# Patient Record
Sex: Male | Born: 1980
Health system: Southern US, Community
[De-identification: ages and names within clinical notes are randomized; demographics above are authoritative.]

## PROBLEM LIST (undated history)

## (undated) DIAGNOSIS — J45909 Unspecified asthma, uncomplicated: Secondary | ICD-10-CM

## (undated) DIAGNOSIS — R51 Headache: Secondary | ICD-10-CM

## (undated) DIAGNOSIS — R519 Headache, unspecified: Secondary | ICD-10-CM

## (undated) DIAGNOSIS — E119 Type 2 diabetes mellitus without complications: Secondary | ICD-10-CM

## (undated) HISTORY — DX: Unspecified asthma, uncomplicated: J45.909

## (undated) HISTORY — PX: OTHER SURGICAL HISTORY: SHX169

## (undated) HISTORY — DX: Type 2 diabetes mellitus without complications: E11.9

## (undated) HISTORY — PX: TONSILLECTOMY: SUR1361

## (undated) HISTORY — DX: Headache, unspecified: R51.9

## (undated) HISTORY — DX: Headache: R51

---

## 2001-08-08 ENCOUNTER — Encounter: Payer: Self-pay | Admitting: *Deleted

## 2001-08-08 ENCOUNTER — Emergency Department (HOSPITAL_COMMUNITY): Admission: EM | Admit: 2001-08-08 | Discharge: 2001-08-08 | Payer: Self-pay | Admitting: *Deleted

## 2002-03-12 ENCOUNTER — Encounter: Payer: Self-pay | Admitting: Emergency Medicine

## 2002-03-12 ENCOUNTER — Emergency Department (HOSPITAL_COMMUNITY): Admission: EM | Admit: 2002-03-12 | Discharge: 2002-03-12 | Payer: Self-pay | Admitting: Emergency Medicine

## 2006-08-04 ENCOUNTER — Emergency Department (HOSPITAL_COMMUNITY): Admission: EM | Admit: 2006-08-04 | Discharge: 2006-08-04 | Payer: Self-pay | Admitting: Emergency Medicine

## 2007-04-18 ENCOUNTER — Emergency Department (HOSPITAL_COMMUNITY): Admission: EM | Admit: 2007-04-18 | Discharge: 2007-04-18 | Payer: Self-pay | Admitting: Emergency Medicine
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
## 2009-04-04 IMAGING — CR DG FOREARM 2V*L*
2 series · 2 of 2 positions shown · non-contrast
Comparison: none

CLINICAL DATA: 26 year-old with left arm pain.
 LEFT FOREARM ? 2 VIEW:

[view not recorded (1 of 2)]
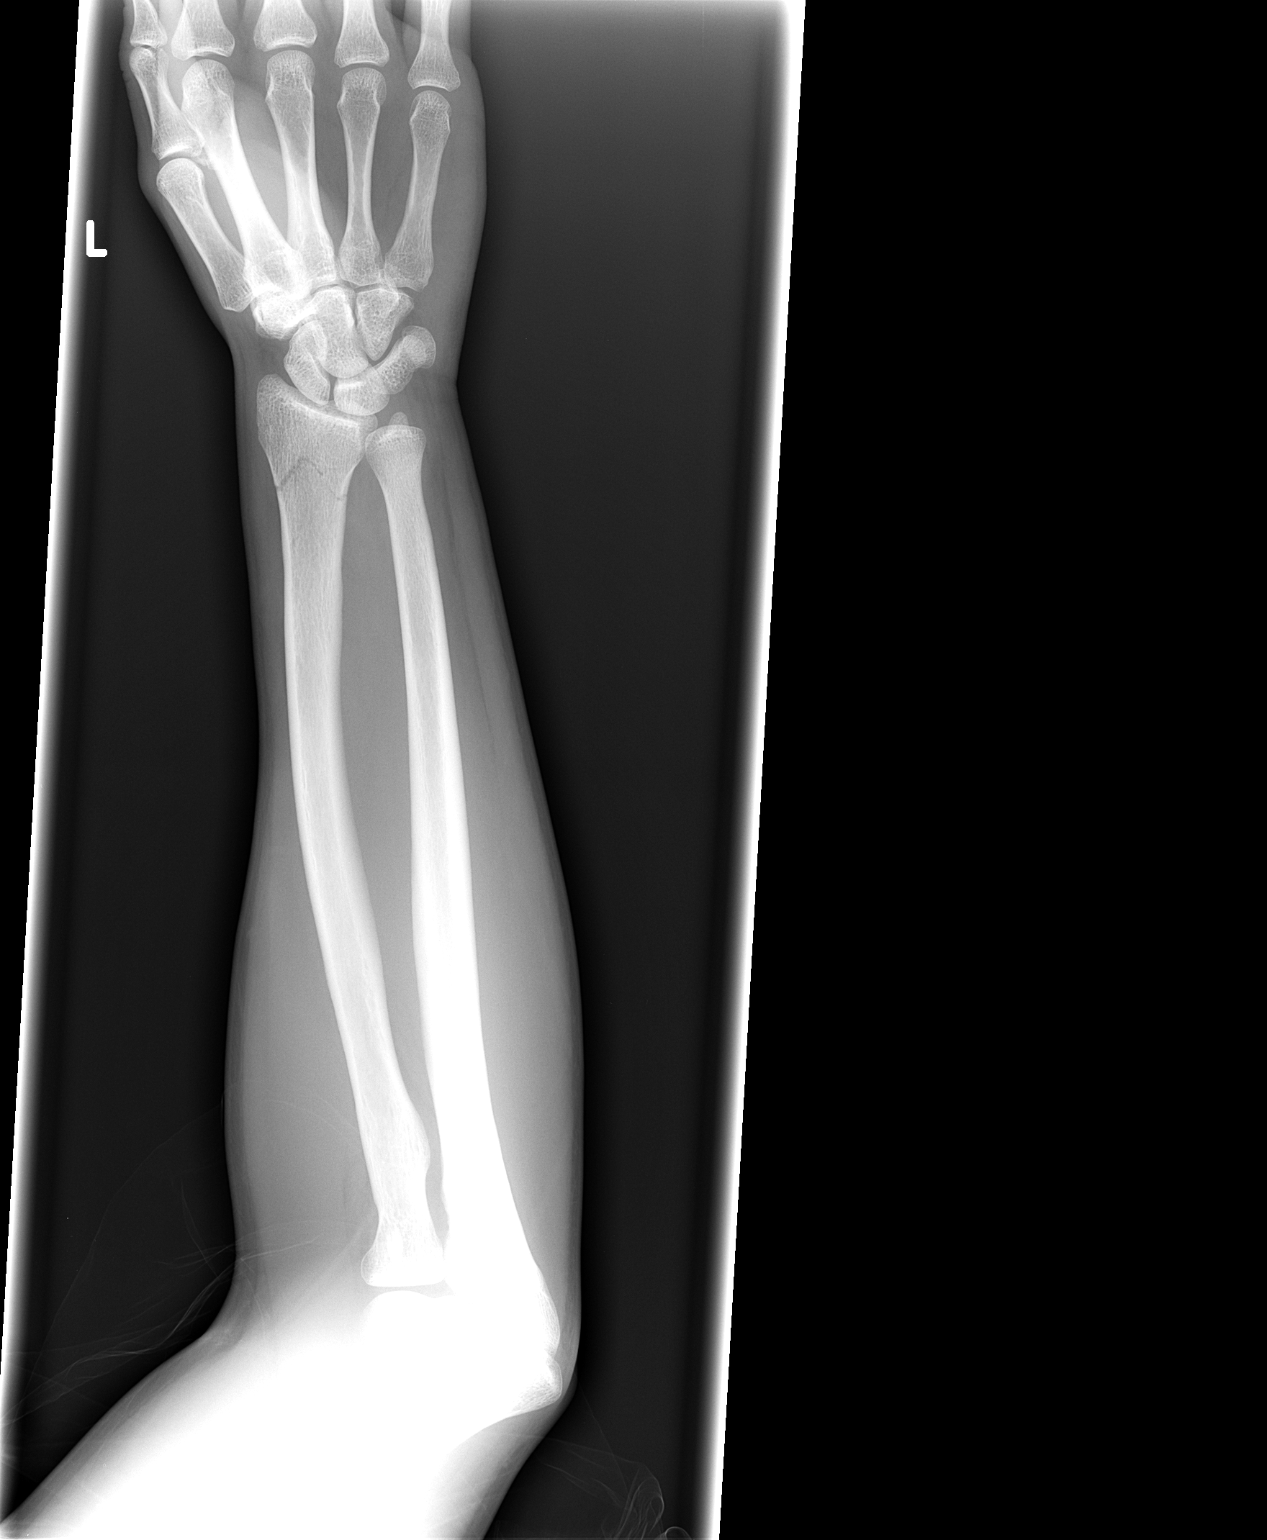

[view not recorded (2 of 2)]
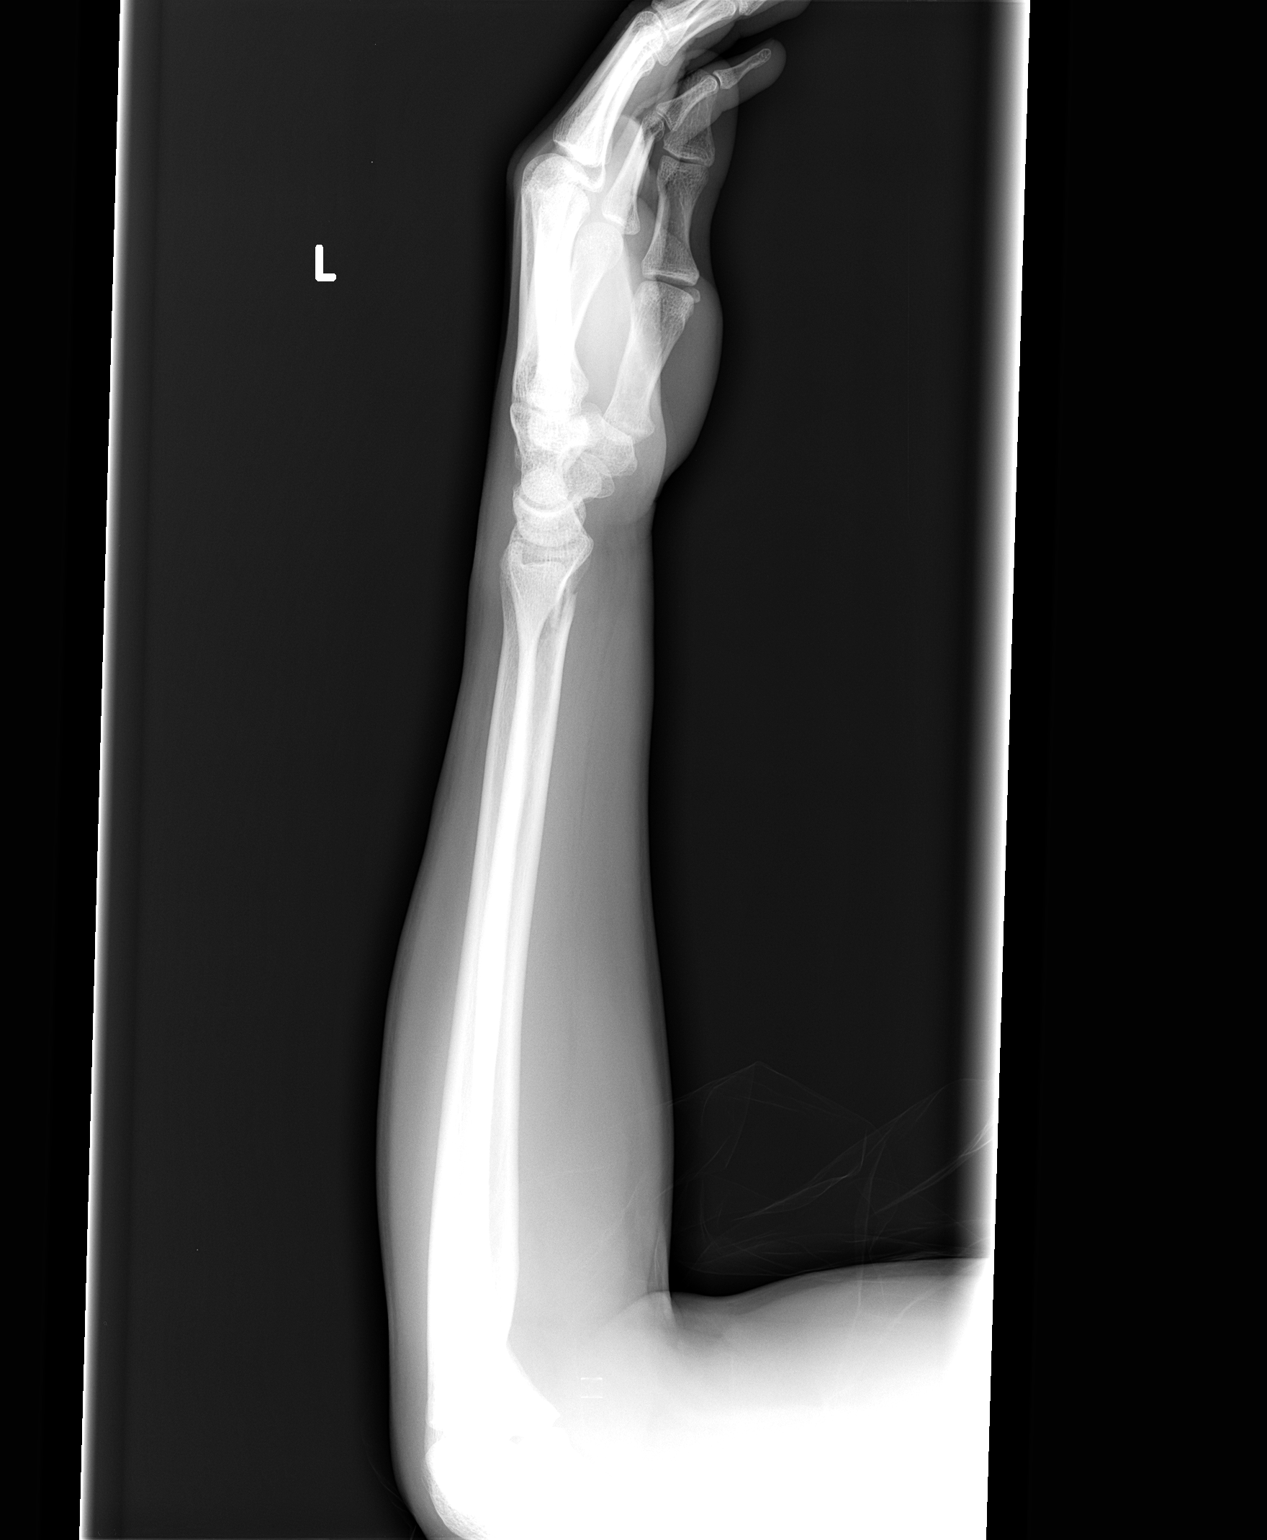

[2 of 2 positions shown; findings below may reference images not displayed]

FINDINGS: Two views of the left forearm demonstrate a minimally displaced fracture involving the distal left radius in the metaphyseal region. No other fractures are identified. The wrist is located.
IMPRESSION: Minimally displaced fracture of the distal left radius.

## 2009-05-16 ENCOUNTER — Ambulatory Visit: Payer: Self-pay | Admitting: Radiology

## 2009-05-16 ENCOUNTER — Emergency Department (HOSPITAL_BASED_OUTPATIENT_CLINIC_OR_DEPARTMENT_OTHER): Admission: EM | Admit: 2009-05-16 | Discharge: 2009-05-17 | Payer: Self-pay | Admitting: Emergency Medicine

## 2010-07-27 ENCOUNTER — Inpatient Hospital Stay (INDEPENDENT_AMBULATORY_CARE_PROVIDER_SITE_OTHER)
Admission: RE | Admit: 2010-07-27 | Discharge: 2010-07-27 | Disposition: A | Discharge status: Home / Self Care | Payer: Commercial Managed Care - PPO | Source: Ambulatory Visit | Attending: Family Medicine | Admitting: Family Medicine

## 2010-07-27 DIAGNOSIS — J069 Acute upper respiratory infection, unspecified: Secondary | ICD-10-CM

## 2014-05-14 ENCOUNTER — Encounter (HOSPITAL_COMMUNITY): Payer: Self-pay | Admitting: Emergency Medicine

## 2014-05-14 ENCOUNTER — Emergency Department (HOSPITAL_COMMUNITY)
Admission: EM | Admit: 2014-05-14 | Discharge: 2014-05-14 | Disposition: A | Discharge status: Home / Self Care | Payer: 59 | Source: Home / Self Care | Attending: Family Medicine | Admitting: Family Medicine

## 2014-05-14 DIAGNOSIS — S40012A Contusion of left shoulder, initial encounter: Secondary | ICD-10-CM

## 2014-05-14 DIAGNOSIS — S5002XA Contusion of left elbow, initial encounter: Secondary | ICD-10-CM

## 2014-05-14 MED ORDER — IBUPROFEN 800 MG PO TABS
800.0000 mg | ORAL_TABLET | Freq: Once | ORAL | Status: AC
  Administered 2014-05-14: 800 mg via ORAL

## 2014-05-14 MED ORDER — IBUPROFEN 800 MG PO TABS
ORAL_TABLET | ORAL | Status: AC
  Filled 2014-05-14: qty 1
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
## 2014-05-14 NOTE — Discharge Instructions (Signed)
Contusion A contusion is a deep bruise. Contusions are the result of an injury that caused bleeding under the skin. The contusion may turn blue, purple, or yellow. Minor injuries will give you a painless contusion, but more severe contusions may stay painful and swollen for a few weeks.  CAUSES  A contusion is usually caused by a blow, trauma, or direct force to an area of the body. SYMPTOMS   Swelling and redness of the injured area.  Bruising of the injured area.  Tenderness and soreness of the injured area.  Pain. DIAGNOSIS  The diagnosis can be made by taking a history and physical exam. An X-ray, CT scan, or MRI may be needed to determine if there were any associated injuries, such as fractures. TREATMENT  Specific treatment will depend on what area of the body was injured. In general, the best treatment for a contusion is resting, icing, elevating, and applying cold compresses to the injured area. Over-the-counter medicines may also be recommended for pain control. Ask your caregiver what the best treatment is for your contusion. HOME CARE INSTRUCTIONS   Put ice on the injured area.  Put ice in a plastic bag.  Place a towel between your skin and the bag.  Leave the ice on for 15-20 minutes, 3-4 times a day, or as directed by your health care provider.  Only take over-the-counter or prescription medicines for pain, discomfort, or fever as directed by your caregiver. Your caregiver may recommend avoiding anti-inflammatory medicines (aspirin, ibuprofen, and naproxen) for 48 hours because these medicines may increase bruising.  Rest the injured area.  If possible, elevate the injured area to reduce swelling. SEEK IMMEDIATE MEDICAL CARE IF:   You have increased bruising or swelling.  You have pain that is getting worse.  Your swelling or pain is not relieved with medicines. MAKE SURE YOU:   Understand these instructions.  Will watch your condition.  Will get help right  away if you are not doing well or get worse. Document Released: 01/11/2005 Document Revised: 04/08/2013 Document Reviewed: 02/06/2011 [facility] Patient Information 2015 [place], [place]. This information is not intended to replace advice given to you by your health care provider. Make sure you discuss any questions you have with your health care provider.  Elbow Contusion An elbow contusion is a deep bruise of the elbow. Contusions are the result of an injury that caused bleeding under the skin. The contusion may turn blue, purple, or yellow. Minor injuries will give you a painless contusion, but more severe contusions may stay painful and swollen for a few weeks.  CAUSES  An elbow contusion comes from a direct force to that area, such as falling on the elbow. SYMPTOMS   Swelling and redness of the elbow.  Bruising of the elbow area.  Tenderness or soreness of the elbow. DIAGNOSIS  You will have a physical exam and will be asked about your history. You may need an X-ray of your elbow to look for a broken bone (fracture).  TREATMENT  A sling or splint may be needed to support your injury. Resting, elevating, and applying cold compresses to the elbow area are often the best treatments for an elbow contusion. Over-the-counter medicines may also be recommended for pain control. HOME CARE INSTRUCTIONS   Put ice on the injured area.  Put ice in a plastic bag.  Place a towel between your skin and the bag.  Leave the ice on for 15-20 minutes, 03-04 times a day.  Only take  over-the-counter or prescription medicines for pain, discomfort, or fever as directed by your caregiver.  Rest your injured elbow until the pain and swelling are better.  Elevate your elbow to reduce swelling.  Apply a compression wrap as directed by your caregiver. This can help reduce swelling and motion. You may remove the wrap for sleeping, showers, and baths. If your fingers become numb, cold, or blue, take the wrap  off and reapply it more loosely.  Use your elbow only as directed by your caregiver. You may be asked to do range of motion exercises. Do them as directed.  See your caregiver as directed. It is very important to keep all follow-up appointments in order to avoid any long-term problems with your elbow, including chronic pain or inability to move your elbow normally. SEEK IMMEDIATE MEDICAL CARE IF:   You have increased redness, swelling, or pain in your elbow.  Your swelling or pain is not relieved with medicines.  You have swelling of the hand and fingers.  You are unable to move your fingers or wrist.  You begin to lose feeling in your hand or fingers.  Your fingers or hand become cold or blue. MAKE SURE YOU:   Understand these instructions.  Will watch your condition.  Will get help right away if you are not doing well or get worse. Document Released: 03/12/2006 Document Revised: 06/26/2011 Document Reviewed: 02/17/2011 [facility] Patient Information 2015 [place], [place]. This information is not intended to replace advice given to you by your health care provider. Make sure you discuss any questions you have with your health care provider.

## 2014-05-14 NOTE — ED Notes (Signed)
Reports falling this am, left elbow, left shoulder, landing on concrete.
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
## 2014-05-14 NOTE — ED Notes (Signed)
Provided ice pack

## 2015-05-06 DIAGNOSIS — Z131 Encounter for screening for diabetes mellitus: Secondary | ICD-10-CM | POA: Diagnosis not present

## 2015-05-06 DIAGNOSIS — R51 Headache: Secondary | ICD-10-CM | POA: Diagnosis not present

## 2015-05-06 DIAGNOSIS — Z0000 Encounter for general adult medical examination without abnormal findings: Secondary | ICD-10-CM | POA: Diagnosis not present

## 2015-05-06 DIAGNOSIS — Z13220 Encounter for screening for lipoid disorders: Secondary | ICD-10-CM | POA: Diagnosis not present

## 2015-06-12 DIAGNOSIS — J0100 Acute maxillary sinusitis, unspecified: Secondary | ICD-10-CM | POA: Diagnosis not present

## 2016-07-06 ENCOUNTER — Telehealth: Payer: 59 | Admitting: Family

## 2016-07-06 DIAGNOSIS — J019 Acute sinusitis, unspecified: Secondary | ICD-10-CM

## 2016-07-06 MED ORDER — FLUTICASONE PROPIONATE 50 MCG/ACT NA SUSP: 2.0000 | Freq: Every day | NASAL | 6 refills | Status: DC

## 2016-07-06 NOTE — Progress Notes (Signed)
We are sorry that you are not feeling well.  Here is how we plan to help!  Based on what you have shared with me it looks like you have sinusitis.  Sinusitis is inflammation and infection in the sinus cavities of the head.  Based on your presentation I believe you most likely have Acute Viral Sinusitis.This is an infection most likely caused by a virus. There is not specific treatment for viral sinusitis other than to help you with the symptoms until the infection runs its course.  You may use an oral decongestant such as Mucinex D or if you have glaucoma or high blood pressure use plain Mucinex. Saline nasal spray help and can safely be used as often as needed for congestion, I have prescribed: Fluticasone nasal spray two sprays in each nostril daily.  Some authorities believe that zinc sprays or the use of Echinacea may shorten the course of your symptoms.  Sinus infections are not as easily transmitted as other respiratory infection, however we still recommend that you avoid close contact with loved ones, especially the very young and elderly.  Remember to wash your hands thoroughly throughout the day as this is the number one way to prevent the spread of infection!  Home Care:  Only take medications as instructed by your medical team.  Complete the entire course of an antibiotic.  Do not take these medications with alcohol.  A steam or ultrasonic humidifier can help congestion.  You can place a towel over your head and breathe in the steam from hot water coming from a faucet.  Avoid close contacts especially the very young and the elderly.  Cover your mouth when you cough or sneeze.  Always remember to wash your hands.  Get Help Right Away If:  You develop worsening fever or sinus pain.  You develop a severe head ache or visual changes.  Your symptoms persist after you have completed your treatment plan.  Make sure you  Understand these instructions.  Will watch your  condition.  Will get help right away if you are not doing well or get worse.  Your e-visit answers were reviewed by a board certified advanced clinical practitioner to complete your personal care plan.  Depending on the condition, your plan could have included both over the counter or prescription medications.  If there is a problem please reply  once you have received a response from your provider.  Your safety is important to us.  If you have drug allergies check your prescription carefully.    You can use MyChart to ask questions about today's visit, request a non-urgent call back, or ask for a work or school excuse for 24 hours related to this e-Visit. If it has been greater than 24 hours you will need to follow up with your provider, or enter a new e-Visit to address those concerns.  You will get an e-mail in the next two days asking about your experience.  I hope that your e-visit has been valuable and will speed your recovery. Thank you for using e-visits.

## 2016-08-10 DIAGNOSIS — J018 Other acute sinusitis: Secondary | ICD-10-CM | POA: Diagnosis not present

## 2016-10-24 DIAGNOSIS — R51 Headache: Secondary | ICD-10-CM | POA: Diagnosis not present

## 2016-10-24 DIAGNOSIS — R739 Hyperglycemia, unspecified: Secondary | ICD-10-CM | POA: Diagnosis not present

## 2016-10-24 DIAGNOSIS — R079 Chest pain, unspecified: Secondary | ICD-10-CM | POA: Diagnosis not present

## 2016-11-08 DIAGNOSIS — E119 Type 2 diabetes mellitus without complications: Secondary | ICD-10-CM | POA: Diagnosis not present

## 2017-02-08 DIAGNOSIS — E119 Type 2 diabetes mellitus without complications: Secondary | ICD-10-CM | POA: Diagnosis not present

## 2017-08-13 DIAGNOSIS — Z23 Encounter for immunization: Secondary | ICD-10-CM | POA: Diagnosis not present

## 2017-08-13 DIAGNOSIS — Z13220 Encounter for screening for lipoid disorders: Secondary | ICD-10-CM | POA: Diagnosis not present

## 2017-08-13 DIAGNOSIS — E119 Type 2 diabetes mellitus without complications: Secondary | ICD-10-CM | POA: Diagnosis not present

## 2017-08-13 DIAGNOSIS — J019 Acute sinusitis, unspecified: Secondary | ICD-10-CM | POA: Diagnosis not present

## 2017-08-13 DIAGNOSIS — Z0000 Encounter for general adult medical examination without abnormal findings: Secondary | ICD-10-CM | POA: Diagnosis not present

## 2017-08-13 MED FILL — AMOX-CLAV 875-125 MG TABLET: 875-125 | 10 days supply | Qty: 20 | Fill #0

## 2017-08-13 MED FILL — FLUTICASONE PROP 50 MCG SPR: 50 | 30 days supply | Qty: 16 | Fill #0

## 2017-09-16 ENCOUNTER — Other Ambulatory Visit: Payer: Self-pay

## 2017-09-16 ENCOUNTER — Encounter (HOSPITAL_COMMUNITY): Payer: Self-pay | Admitting: Emergency Medicine

## 2017-09-16 ENCOUNTER — Ambulatory Visit (HOSPITAL_COMMUNITY)
Admission: EM | Admit: 2017-09-16 | Discharge: 2017-09-16 | Disposition: A | Discharge status: Home / Self Care | Payer: 59 | Attending: Emergency Medicine | Admitting: Emergency Medicine

## 2017-09-16 DIAGNOSIS — Z885 Allergy status to narcotic agent status: Secondary | ICD-10-CM | POA: Diagnosis not present

## 2017-09-16 DIAGNOSIS — H9202 Otalgia, left ear: Secondary | ICD-10-CM | POA: Diagnosis not present

## 2017-09-16 DIAGNOSIS — Z79899 Other long term (current) drug therapy: Secondary | ICD-10-CM | POA: Insufficient documentation

## 2017-09-16 DIAGNOSIS — J029 Acute pharyngitis, unspecified: Secondary | ICD-10-CM | POA: Insufficient documentation

## 2017-09-16 LAB — POCT RAPID STREP A: Streptococcus, Group A Screen (Direct): NEGATIVE

## 2017-09-16 MED ORDER — IBUPROFEN 800 MG PO TABS: 800.0000 mg | ORAL_TABLET | Freq: Three times a day (TID) | ORAL | 0 refills | Status: DC

## 2017-09-16 NOTE — ED Triage Notes (Addendum)
Sore throat and left ear soreness for 2 days. Denies fever.  Throat tenderness is on left side, not right side of neck.

## 2017-09-16 NOTE — Discharge Instructions (Signed)

## 2017-09-17 NOTE — ED Provider Notes (Signed)
[facility]    CSN: [id number] Arrival date & time: 09/16/17  1925     History   Chief Complaint Chief Complaint  Patient presents with  . Otalgia    HPI Dennis Simon is a 37 y.o. male presenting today for evaluation of a sore throat and left ear pain.  Patient states that symptoms began Friday, approximately 3 days ago.  He states that he is felt swelling in his left neck that he believes has been worsening.  Having difficulties sleeping.  Has progressed to left ear pain.  Denies any other URI symptoms of congestion or coughing.  Denies fevers.  Denies nausea or vomiting.  He is taking an allergy pill daily for 3 to 4 weeks, as well as more recently ibuprofen.  HPI  History reviewed. No pertinent past medical history.  There are no active problems to display for this patient.   History reviewed. No pertinent surgical history.     Home Medications    Prior to Admission medications   Medication Sig Start Date End Date Taking? Authorizing Provider  loratadine (CLARITIN) 10 MG tablet Take 10 mg by mouth daily.   Yes [provider]  loratadine-pseudoephedrine (PX ALLERGY RELIEF D, LORATID,) 5-120 MG tablet Take 1 tablet by mouth 2 (two) times daily.   Yes [provider]  fluticasone (FLONASE) 50 MCG/ACT nasal spray Place 2 sprays into both nostrils daily. 07/06/16   Withrow, Everardo AllJohn C, FNP  ibuprofen (ADVIL,MOTRIN) 800 MG tablet Take 1 tablet (800 mg total) by mouth 3 (three) times daily. 09/16/17   Wieters, Junius CreamerHallie C, PA-C    Family History History reviewed. No pertinent family history.  Social History Social History   Tobacco Use  . Smoking status: Never Smoker  Substance Use Topics  . Alcohol use: No  . Drug use: No     Allergies   Hydrocodone   Review of Systems Review of Systems  Constitutional: Negative for activity change, appetite change, fatigue and fever.  HENT: Positive for ear pain and sore throat. Negative for  congestion, postnasal drip, rhinorrhea and sinus pressure.   Eyes: Negative for pain and itching.  Respiratory: Negative for cough and shortness of breath.   Cardiovascular: Negative for chest pain.  Gastrointestinal: Negative for abdominal pain, diarrhea, nausea and vomiting.  Musculoskeletal: Negative for myalgias.  Skin: Negative for rash.  Neurological: Negative for dizziness, light-headedness and headaches.     Physical Exam Triage Vital Signs ED Triage Vitals  Enc Vitals Group     BP 09/16/17 2014 139/89     Pulse Rate 09/16/17 2014 71     Resp 09/16/17 2014 18     Temp 09/16/17 2014 98.4 F (36.9 C)     Temp Source 09/16/17 2014 Oral     SpO2 09/16/17 2014 100 %     Weight --      Height --      Head Circumference --      Peak Flow --      Pain Score 09/16/17 2012 7     Pain Loc --      Pain Edu? --      Excl. in GC? --    No data found.  Updated Vital Signs BP 139/89 (BP Location: Left Arm)   Pulse 71   Temp 98.4 F (36.9 C) (Oral)   Resp 18   SpO2 100%   Visual Acuity Right Eye Distance:   Left Eye Distance:   Bilateral Distance:  Right Eye Near:   Left Eye Near:    Bilateral Near:     Physical Exam  Constitutional: He appears well-developed and well-nourished. No distress.  HENT:  Head: Normocephalic and atraumatic.  Bilateral ears without tenderness to palpation of external auricle, tragus and mastoid, EAC's without erythema or swelling, TM's with good bony landmarks and cone of light. Non erythematous.  Oral mucosa pink and moist, no tonsillar enlargement or exudate. Posterior pharynx patent and erythematous-drainage present in posterior pharynx, no uvula deviation or swelling. Normal phonation.   Eyes: Conjunctivae are normal.  Neck: Neck supple.  No cervical lymph adenopathy, patient did have mild tenderness to palpation of left tonsillar area  Cardiovascular: Normal rate and regular rhythm.  No murmur heard. Pulmonary/Chest: Effort  normal and breath sounds normal. No respiratory distress.  Breathing comfortably at rest, CTABL, no wheezing, rales or other adventitious sounds auscultated  Abdominal: Soft. There is no tenderness.  Musculoskeletal: He exhibits no edema.  Neurological: He is alert.  Skin: Skin is warm and dry.  Psychiatric: He has a normal mood and affect.  Nursing note and vitals reviewed.    UC Treatments / Results  Labs (all labs ordered are listed, but only abnormal results are displayed) Labs Reviewed  CULTURE, GROUP A STREP [facility])  POCT RAPID STREP A    EKG None  Radiology No results found.  Procedures Procedures (including critical care time)  Medications Ordered in UC Medications - No data to display  Initial Impression / Assessment and Plan / UC Course  I have reviewed the triage vital signs and the nursing notes.  Pertinent labs & imaging results that were available during my care of the patient were reviewed by me and considered in my medical decision making (see chart for details).     Strep test negative, likely viral etiology versus related to worsening drainage.  Discussed OTC measures below for further management.  Expect self resolution.Discussed strict return precautions. Patient verbalized understanding and is agreeable with plan.  Final Clinical Impressions(s) / UC Diagnoses   Final diagnoses:  Sore throat     Discharge Instructions     Sore Throat  Your rapid strep tested Negative today. We will send for a culture and call in about 2 days if results are positive. For now we will treat your sore throat as a virus with symptom management.   Please continue Tylenol or Ibuprofen for fever and pain. May try salt water gargles, cepacol lozenges, throat spray, or OTC cold relief medicine for throat discomfort. If you also have congestion take a daily anti-histamine like Zyrtec, Claritin, and a oral decongestant to help with post nasal drip that may be irritating your  throat.   Stay hydrated and drink plenty of fluids to keep your throat coated relieve irritation.    ED Prescriptions    Medication Sig Dispense Auth. Provider   ibuprofen (ADVIL,MOTRIN) 800 MG tablet Take 1 tablet (800 mg total) by mouth 3 (three) times daily. 21 tablet Wieters, [place] C, PA-C     Controlled Substance Prescriptions [place] Controlled Substance Registry consulted? Not Applicable   Lew Dawes, [place] 09/17/17 1610

## 2017-09-19 LAB — CULTURE, GROUP A STREP (THRC)

## 2017-12-12 DIAGNOSIS — M25562 Pain in left knee: Secondary | ICD-10-CM | POA: Diagnosis not present

## 2017-12-12 MED FILL — MELOXICAM 15 MG TABLET: 15 | 30 days supply | Qty: 30 | Fill #0

## 2018-02-13 DIAGNOSIS — R42 Dizziness and giddiness: Secondary | ICD-10-CM | POA: Diagnosis not present

## 2018-02-13 DIAGNOSIS — R51 Headache: Secondary | ICD-10-CM | POA: Diagnosis not present

## 2018-02-13 DIAGNOSIS — E119 Type 2 diabetes mellitus without complications: Secondary | ICD-10-CM | POA: Diagnosis not present

## 2018-04-19 DIAGNOSIS — H903 Sensorineural hearing loss, bilateral: Secondary | ICD-10-CM | POA: Diagnosis not present

## 2018-04-19 DIAGNOSIS — R42 Dizziness and giddiness: Secondary | ICD-10-CM | POA: Diagnosis not present

## 2018-04-19 DIAGNOSIS — H8111 Benign paroxysmal vertigo, right ear: Secondary | ICD-10-CM | POA: Diagnosis not present

## 2018-04-23 DIAGNOSIS — E119 Type 2 diabetes mellitus without complications: Secondary | ICD-10-CM | POA: Diagnosis not present

## 2018-05-14 ENCOUNTER — Encounter: Payer: Self-pay | Admitting: *Deleted

## 2018-05-15 ENCOUNTER — Ambulatory Visit: Payer: 59 | Admitting: Neurology

## 2018-05-15 ENCOUNTER — Encounter: Payer: Self-pay | Admitting: Neurology

## 2018-05-15 VITALS — BP 136/84 | HR 56 | Ht 67.5 in | Wt 191.0 lb

## 2018-05-15 DIAGNOSIS — A881 Epidemic vertigo: Secondary | ICD-10-CM

## 2018-05-15 DIAGNOSIS — R42 Dizziness and giddiness: Secondary | ICD-10-CM | POA: Diagnosis not present

## 2018-05-15 DIAGNOSIS — G43909 Migraine, unspecified, not intractable, without status migrainosus: Secondary | ICD-10-CM | POA: Insufficient documentation

## 2018-05-15 DIAGNOSIS — G43709 Chronic migraine without aura, not intractable, without status migrainosus: Secondary | ICD-10-CM

## 2018-05-15 DIAGNOSIS — IMO0002 Reserved for concepts with insufficient information to code with codable children: Secondary | ICD-10-CM

## 2018-05-15 MED ORDER — SUMATRIPTAN SUCCINATE 100 MG PO TABS: 100.0000 mg | ORAL_TABLET | Freq: Once | ORAL | 6 refills | Status: DC | PRN

## 2018-05-15 MED ORDER — TOPIRAMATE 100 MG PO TABS: 100.0000 mg | ORAL_TABLET | Freq: Two times a day (BID) | ORAL | 11 refills | Status: DC

## 2018-05-15 MED FILL — SUMAtriptan SUCCINATE 100 M: 100 | 30 days supply | Qty: 9 | Fill #0

## 2018-05-15 MED FILL — TOPIRAMATE 100 MG TABLET: 100 | 30 days supply | Qty: 60 | Fill #0

## 2018-05-15 NOTE — Progress Notes (Signed)
PATIENT: Dennis DouglasBradford D Cashen DOB: 1980-10-15  Chief Complaint  Patient presents with  . Headache    He estimates having 15 headache days each month, over the last few months.  He does not get relief with OTC NSAIDS.  He sometimes has dizziness, nausea and vomiting.  He has seen by his eye doctor with no concerns.  He has seen ENT, who did the Epley Maneuever in the office, which helped his dizziness some.  He has another follow up with ENT next week.  Marland Kitchen. PCP    Farris HasMorrow, Aaron, MD     HISTORICAL  Dennis Simon is a 38 year old male, seen in request by his primary care physician Dr. Farris HasMorrow, Aaron, for evaluation of headaches.  Initial evaluation was on May 15, 2018.  I have reviewed and summarized the referring note from the referring physician.  He began to develop headaches since 2016, gradually getting worse, for a while he was having daily headaches, has been taking ibuprofen 16 tablets or more on a daily basis, his typical headache are retro- orbital area severe pounding headaches with associated light, noise sensitivity, nauseous, one side over worse than the other side, lasting for hours or longer,  He was never treated with triptan or preventive medications in the past, he was told by his primary care physician to stop daily ibuprofen use to avoid rebound headache since 2019, he has mild improvement, now he has 2-3 major headaches each week, only partially relieved by ibuprofen,  His mother died of brain aneurysm at age 432,  Since 2018, he also had recurrent episode of sudden onset vertigo, can lasting hours to couple days, often associated with headaches, was seen by ENT, patient described nystagmus that was triggered by repositioning maneuver, he also complains of mild hearing loss, does working at noisy environment at [employer] [place]  REVIEW OF SYSTEMS: Full 14 system review of systems performed and notable only for as above All other review of systems  were negative.  ALLERGIES: Allergies  Allergen Reactions  . Hydrocodone Anaphylaxis    HOME MEDICATIONS: Current Outpatient Medications  Medication Sig Dispense Refill  . ibuprofen (ADVIL,MOTRIN) 200 MG tablet Take 200-400 mg by mouth as needed.     No current facility-administered medications for this visit.     PAST MEDICAL HISTORY: Past Medical History:  Diagnosis Date  . Diabetes (HCC)   . Headache     PAST SURGICAL HISTORY: Past Surgical History:  Procedure Laterality Date  . TONSILLECTOMY    . tubes in ears      FAMILY HISTORY: Family History  Problem Relation Age of Onset  . Diabetes Mother   . Aneurysm Mother   . Other Father        died in house fire  . Cancer Maternal Grandmother        unsure of type    SOCIAL HISTORY: Social History   Socioeconomic History  . Marital status: Legally Separated    Spouse name: Not on file  . Number of children: 1  . Years of education: 3012  . Highest education level: High school graduate  Occupational History  . Occupation: facility maintenance tech  Social Needs  . Financial resource strain: Not on file  . Food insecurity:    Worry: Not on file    Inability: Not on file  . Transportation needs:    Medical: Not on file    Non-medical: Not on file  Tobacco Use  . Smoking  status: Never Smoker  . Smokeless tobacco: Never Used  Substance and Sexual Activity  . Alcohol use: No  . Drug use: No  . Sexual activity: Not on file  Lifestyle  . Physical activity:    Days per week: Not on file    Minutes per session: Not on file  . Stress: Not on file  Relationships  . Social connections:    Talks on phone: Not on file    Gets together: Not on file    Attends religious service: Not on file    Active member of club or organization: Not on file    Attends meetings of clubs or organizations: Not on file    Relationship status: Not on file  . Intimate partner violence:    Fear of current or ex partner: Not on  file    Emotionally abused: Not on file    Physically abused: Not on file    Forced sexual activity: Not on file  Other Topics Concern  . Not on file  Social History Narrative   Lives at home alone.   Right-handed.   3-4 cups caffeine per day.     PHYSICAL EXAM   Vitals:   05/15/18 0711  BP: 136/84  Pulse: (!) 56  Weight: 191 lb (86.6 kg)  Height: 5' 7.5" (1.715 m)    Not recorded      Body mass index is 29.47 kg/m.  PHYSICAL EXAMNIATION:  Gen: NAD, conversant, well nourised, obese, well groomed                     Cardiovascular: Regular rate rhythm, no peripheral edema, warm, nontender. Eyes: Conjunctivae clear without exudates or hemorrhage Neck: Supple, no carotid bruits. Pulmonary: Clear to auscultation bilaterally   NEUROLOGICAL EXAM:  MENTAL STATUS: Speech:    Speech is normal; fluent and spontaneous with normal comprehension.  Cognition:     Orientation to time, place and person     Normal recent and remote memory     Normal Attention span and concentration     Normal Language, naming, repeating,spontaneous speech     Fund of knowledge   CRANIAL NERVES: CN II: Visual fields are full to confrontation. Fundoscopic exam is normal with sharp discs and no vascular changes. Pupils are round equal and briskly reactive to light. CN III, IV, VI: extraocular movement are normal. No ptosis. CN V: Facial sensation is intact to pinprick in all 3 divisions bilaterally. Corneal responses are intact.  CN VII: Face is symmetric with normal eye closure and smile. CN VIII: Hearing is normal to rubbing fingers CN IX, X: Palate elevates symmetrically. Phonation is normal. CN XI: Head turning and shoulder shrug are intact CN XII: Tongue is midline with normal movements and no atrophy.  MOTOR: There is no pronator drift of out-stretched arms. Muscle bulk and tone are normal. Muscle strength is normal.  REFLEXES: Reflexes are 2+ and symmetric at the biceps, triceps,  knees, and ankles. Plantar responses are flexor.  SENSORY: Intact to light touch, pinprick, positional sensation and vibratory sensation are intact in fingers and toes.  COORDINATION: Rapid alternating movements and fine finger movements are intact. There is no dysmetria on finger-to-nose and heel-knee-shin.    GAIT/STANCE: Posture is normal. Gait is steady with normal steps, base, arm swing, and turning. Heel and toe walking are normal. Tandem gait is normal.  Romberg is absent.   DIAGNOSTIC DATA (LABS, IMAGING, TESTING) - I reviewed patient records, labs, notes, testing  and imaging myself where available.   ASSESSMENT AND PLAN  Dennis DouglasBradford D Anthes is a 38 y.o. male   Chronic migraine headaches Episodic vertigo  MRI of the brain with without contrast to rule out inner ear pathology  Could be in the spectrum of his migraine headaches  Also has a component of medicine rebound headache  I have advised him to stop frequent ibuprofen use  Start Topamax 100 mg twice a day as preventive medication  Imitrex 100 mg as needed    Levert FeinsteinYijun Ahlijah Raia, M.D. Ph.D.  [facility] [address], [address] [place], [place] [address] Ph: [phone] Fax: [phone]  CC:Farris HasMorrow, Aaron, MD

## 2018-05-16 ENCOUNTER — Telehealth: Payer: Self-pay | Admitting: Neurology

## 2018-05-16 DIAGNOSIS — H81399 Other peripheral vertigo, unspecified ear: Secondary | ICD-10-CM

## 2018-05-16 NOTE — Addendum Note (Signed)
Addended by: Levert Feinstein on: 05/16/2018 10:24 AM   Modules accepted: Orders

## 2018-05-16 NOTE — Telephone Encounter (Signed)
MR Brain w/wo contrast Dr. [facility] Auth: NPR Ref # [id number]. Patient is scheduled at [facility] for 05/21/18

## 2018-05-16 NOTE — Telephone Encounter (Signed)
When you get a chance can you put a new MRI order in because for some reason the mobile unit will not let me schedule it when the IAC is attached to it.Dennis Simon

## 2018-05-16 NOTE — Telephone Encounter (Signed)
Noted, thank you

## 2018-05-21 ENCOUNTER — Other Ambulatory Visit: Payer: 59

## 2018-08-29 ENCOUNTER — Ambulatory Visit: Payer: 59 | Admitting: Neurology

## 2018-09-16 DIAGNOSIS — Z0000 Encounter for general adult medical examination without abnormal findings: Secondary | ICD-10-CM | POA: Diagnosis not present

## 2018-09-16 DIAGNOSIS — E119 Type 2 diabetes mellitus without complications: Secondary | ICD-10-CM | POA: Diagnosis not present

## 2018-09-16 DIAGNOSIS — R51 Headache: Secondary | ICD-10-CM | POA: Diagnosis not present

## 2019-01-22 DIAGNOSIS — R030 Elevated blood-pressure reading, without diagnosis of hypertension: Secondary | ICD-10-CM | POA: Diagnosis not present

## 2019-01-22 DIAGNOSIS — M62838 Other muscle spasm: Secondary | ICD-10-CM | POA: Diagnosis not present

## 2019-01-22 DIAGNOSIS — H8113 Benign paroxysmal vertigo, bilateral: Secondary | ICD-10-CM | POA: Diagnosis not present

## 2019-01-22 MED FILL — CYCLOBENZAPRINE 10 MG TAB: 10 | 30 days supply | Qty: 30 | Fill #0

## 2019-01-22 MED FILL — MELOXICAM 15 MG TABLET: 15 | 30 days supply | Qty: 30 | Fill #0

## 2019-05-14 DIAGNOSIS — E119 Type 2 diabetes mellitus without complications: Secondary | ICD-10-CM | POA: Diagnosis not present

## 2019-05-14 DIAGNOSIS — R030 Elevated blood-pressure reading, without diagnosis of hypertension: Secondary | ICD-10-CM | POA: Diagnosis not present

## 2019-06-20 DIAGNOSIS — E119 Type 2 diabetes mellitus without complications: Secondary | ICD-10-CM | POA: Diagnosis not present

## 2019-06-20 DIAGNOSIS — R030 Elevated blood-pressure reading, without diagnosis of hypertension: Secondary | ICD-10-CM | POA: Diagnosis not present

## 2019-06-25 MED FILL — metFORMIN HCL 500 MG TABS: 500 | 90 days supply | Qty: 180 | Fill #0

## 2019-09-25 DIAGNOSIS — E119 Type 2 diabetes mellitus without complications: Secondary | ICD-10-CM | POA: Diagnosis not present

## 2019-09-25 DIAGNOSIS — Z13220 Encounter for screening for lipoid disorders: Secondary | ICD-10-CM | POA: Diagnosis not present

## 2019-09-25 DIAGNOSIS — Z0000 Encounter for general adult medical examination without abnormal findings: Secondary | ICD-10-CM | POA: Diagnosis not present

## 2019-09-25 DIAGNOSIS — R350 Frequency of micturition: Secondary | ICD-10-CM | POA: Diagnosis not present

## 2020-10-28 DIAGNOSIS — Z125 Encounter for screening for malignant neoplasm of prostate: Secondary | ICD-10-CM | POA: Diagnosis not present

## 2020-10-28 DIAGNOSIS — Z0000 Encounter for general adult medical examination without abnormal findings: Secondary | ICD-10-CM | POA: Diagnosis not present

## 2020-10-28 DIAGNOSIS — E119 Type 2 diabetes mellitus without complications: Secondary | ICD-10-CM | POA: Diagnosis not present

## 2020-10-28 DIAGNOSIS — Z13220 Encounter for screening for lipoid disorders: Secondary | ICD-10-CM | POA: Diagnosis not present

## 2020-11-12 ENCOUNTER — Other Ambulatory Visit (HOSPITAL_COMMUNITY): Payer: Self-pay

## 2020-11-12 MED ORDER — PRAVASTATIN SODIUM 20 MG PO TABS
20.0000 mg | ORAL_TABLET | Freq: Every day | ORAL | 3 refills | Status: DC
  Filled 2020-11-12: qty 90, 90d supply, fill #0

## 2020-11-22 ENCOUNTER — Other Ambulatory Visit (HOSPITAL_COMMUNITY): Payer: Self-pay

## 2021-01-17 DIAGNOSIS — E119 Type 2 diabetes mellitus without complications: Secondary | ICD-10-CM | POA: Diagnosis not present

## 2021-03-04 DIAGNOSIS — J019 Acute sinusitis, unspecified: Secondary | ICD-10-CM | POA: Diagnosis not present

## 2021-07-01 DIAGNOSIS — M545 Low back pain, unspecified: Secondary | ICD-10-CM | POA: Diagnosis not present

## 2021-07-01 DIAGNOSIS — E119 Type 2 diabetes mellitus without complications: Secondary | ICD-10-CM | POA: Diagnosis not present

## 2021-07-05 ENCOUNTER — Other Ambulatory Visit (HOSPITAL_COMMUNITY): Payer: Self-pay

## 2021-08-03 DIAGNOSIS — J302 Other seasonal allergic rhinitis: Secondary | ICD-10-CM | POA: Diagnosis not present

## 2021-08-03 DIAGNOSIS — J0100 Acute maxillary sinusitis, unspecified: Secondary | ICD-10-CM | POA: Diagnosis not present

## 2021-09-08 ENCOUNTER — Ambulatory Visit: Payer: 59 | Admitting: Neurology

## 2021-09-08 ENCOUNTER — Encounter: Payer: Self-pay | Admitting: Neurology

## 2021-09-08 VITALS — BP 142/93 | HR 61 | Ht 67.0 in | Wt 174.0 lb

## 2021-09-08 DIAGNOSIS — G43709 Chronic migraine without aura, not intractable, without status migrainosus: Secondary | ICD-10-CM

## 2021-09-08 DIAGNOSIS — H9190 Unspecified hearing loss, unspecified ear: Secondary | ICD-10-CM | POA: Insufficient documentation

## 2021-09-08 MED ORDER — PROPRANOLOL HCL ER 80 MG PO CP24: 80.0000 mg | ORAL_CAPSULE | Freq: Every day | ORAL | 11 refills | Status: DC

## 2021-09-08 MED ORDER — RIZATRIPTAN BENZOATE 10 MG PO TBDP: 10.0000 mg | ORAL_TABLET | ORAL | 6 refills | Status: DC | PRN

## 2021-09-08 NOTE — Progress Notes (Addendum)
Chief Complaint  Patient presents with   New Patient (Initial Visit)    Room 13 NX Dennis Simon 04/2018/Paper [name] MD [place] at [place] [id number] follow up/ Reports 2-3 headaches a week, not currently taking medication for it       ASSESSMENT AND PLAN  Dennis Simon is a 41 y.o. male   Chronic migraine headaches Slow onset bilateral hearing loss, increased tinnitus,  Mother died of brain aneurysm at age 12 He is worriedabout the possibility of brain structure abnormality, proceed with MRI of the brain with without contrast through internal acoustic canal Inderal LA 80 mg as preventive medication Maxalt 10 mg as needed Return to clinic With nurse practitioner in 6 months    DIAGNOSTIC DATA (LABS, IMAGING, TESTING) - I reviewed patient records, labs, notes, testing and imaging myself where available.  From Dennis Simon in March 2023, normal CMP creatinine of 1, with exception of elevated glucose 133, A1c 6.6, LDL 119,  MEDICAL HISTORY:  Dennis Simon is a 41 year old male, seen in request by his primary care doctor Dennis Simon, for evaluation of chronic migraine headaches,  I reviewed and summarized the referring note. PMHX Pre-DM  I saw him in 2020 for chronic migraine headaches, which started in his late 11s, holoacranial pounding headache with light noise sensitivity, nauseous, lasting hours of days  His mother died of brain aneurysm at age 56  He was put on Topamax 100 mg twice a day as preventive medications, his headache did improve, less severe, he tried Imitrex, which works well for his headache, but after Imitrex, he often felt skin on fire  After he ran out of the prescription around 20, he relied on his over-the-counter ibuprofen as needed, reported worsening headaches since October 2022, he can have up to 15 headaches daily a month, variable degree, most severe headaches, even 5 tablets of ibuprofen would not touch it, severe pounding  headache with light noise sensitivity, nauseous, occasionally headache is preceded by decreased peripheral visual field.  Treatment for his migraine light, weather changes, heavily seasoned food,  He also complains of intermittent bilateral tinnitus, gradual onset bilateral hearing loss over the past few years  PHYSICAL EXAM:   Vitals:   09/08/21 0745  BP: (!) 142/93  Pulse: 61  Weight: 174 lb (78.9 kg)  Height: 5\' 7"  (1.702 m)     Body mass index is 27.25 kg/m.  PHYSICAL EXAMNIATION:  Gen: NAD, conversant, well nourised, well groomed                     Cardiovascular: Regular rate rhythm, no peripheral edema, warm, nontender. Eyes: Conjunctivae clear without exudates or hemorrhage Neck: Supple, no carotid bruits. Pulmonary: Clear to auscultation bilaterally   NEUROLOGICAL EXAM:  MENTAL STATUS: Speech/cognition: Awake, alert, oriented to history taking and casual conversation CRANIAL NERVES: CN II: Visual fields are full to confrontation. Pupils are round equal and briskly reactive to light. CN III, IV, VI: extraocular movement are normal. No ptosis. CN V: Facial sensation is intact to light touch CN VII: Face is symmetric with normal eye closure  CN VIII: Mild decreased hearing on casual conversation, air conduction more than pulmonary conduction, Weber's test were midline CN IX, X: Phonation is normal. CN XI: Head turning and shoulder shrug are intact  MOTOR: There is no pronator drift of out-stretched arms. Muscle bulk and tone are normal. Muscle strength is normal.  REFLEXES: Reflexes are 2+ and symmetric at the biceps, triceps, knees, and  ankles. Plantar responses are flexor.  SENSORY: Intact to light touch, pinprick and vibratory sensation are intact in fingers and toes.  COORDINATION: There is no trunk or limb dysmetria noted.  GAIT/STANCE: Posture is normal. Gait is steady with normal steps, base, arm swing, and turning. Heel and toe walking are normal.  Tandem gait is normal.  Romberg is absent.  REVIEW OF SYSTEMS:  Full 14 system review of systems performed and notable only for as above All other review of systems were negative.   ALLERGIES: Allergies  Allergen Reactions   Hydrocodone Anaphylaxis    HOME MEDICATIONS: Current Outpatient Medications  Medication Sig Dispense Refill   ibuprofen (ADVIL,MOTRIN) 200 MG tablet Take 200-400 mg by mouth as needed.     No current facility-administered medications for this visit.    PAST MEDICAL HISTORY: Past Medical History:  Diagnosis Date   Diabetes ([place])    Headache     PAST SURGICAL HISTORY: Past Surgical History:  Procedure Laterality Date   TONSILLECTOMY     tubes in ears      FAMILY HISTORY: Family History  Problem Relation Age of Onset   Diabetes Mother    Aneurysm Mother    Other Father        died in house fire   Cancer Maternal Grandmother        unsure of type    SOCIAL HISTORY: Social History   Socioeconomic History   Marital status: Legally Separated    Spouse name: Not on file   Number of children: 1   Years of education: 12   Highest education level: High school graduate  Occupational History   Occupation: facility maintenance tech  Tobacco Use   Smoking status: Never   Smokeless tobacco: Never  Substance and Sexual Activity   Alcohol use: No   Drug use: No   Sexual activity: Not on file  Other Topics Concern   Not on file  Social History Narrative   Lives at home alone.   Right-handed.   3-4 cups caffeine per day.   Social Determinants of Health   Financial Resource Strain: Not on file  Food Insecurity: Not on file  Transportation Needs: Not on file  Physical Activity: Not on file  Stress: Not on file  Social Connections: Not on file  Intimate Partner Violence: Not on file      Dennis Simon, M.D. Ph.D.  [facility] [address] [place], [place] [address] Ph: [phone] Fax:  [phone]  CC:  Dennis Pepper, MD [place],  [place] [address]  Dennis Pepper, MD  -----------------------

## 2021-09-15 ENCOUNTER — Telehealth: Payer: Self-pay | Admitting: Neurology

## 2021-09-15 NOTE — Telephone Encounter (Signed)
[facility] Auth: NPR case [id number] sent to GI

## 2021-09-26 ENCOUNTER — Encounter: Payer: Self-pay | Admitting: Neurology

## 2021-09-28 DIAGNOSIS — K649 Unspecified hemorrhoids: Secondary | ICD-10-CM | POA: Diagnosis not present

## 2021-09-29 ENCOUNTER — Other Ambulatory Visit: Payer: 59

## 2021-11-02 DIAGNOSIS — E1169 Type 2 diabetes mellitus with other specified complication: Secondary | ICD-10-CM | POA: Diagnosis not present

## 2021-11-02 DIAGNOSIS — Z0000 Encounter for general adult medical examination without abnormal findings: Secondary | ICD-10-CM | POA: Diagnosis not present

## 2021-11-02 DIAGNOSIS — Z125 Encounter for screening for malignant neoplasm of prostate: Secondary | ICD-10-CM | POA: Diagnosis not present

## 2021-11-02 DIAGNOSIS — R413 Other amnesia: Secondary | ICD-10-CM | POA: Diagnosis not present

## 2021-11-02 DIAGNOSIS — Z13220 Encounter for screening for lipoid disorders: Secondary | ICD-10-CM | POA: Diagnosis not present

## 2021-11-02 DIAGNOSIS — R5383 Other fatigue: Secondary | ICD-10-CM | POA: Diagnosis not present

## 2021-12-05 DIAGNOSIS — E291 Testicular hypofunction: Secondary | ICD-10-CM | POA: Diagnosis not present

## 2021-12-05 DIAGNOSIS — R945 Abnormal results of liver function studies: Secondary | ICD-10-CM | POA: Diagnosis not present

## 2021-12-26 DIAGNOSIS — E291 Testicular hypofunction: Secondary | ICD-10-CM | POA: Diagnosis not present

## 2022-03-08 ENCOUNTER — Encounter: Payer: Self-pay | Admitting: Neurology

## 2022-03-13 ENCOUNTER — Ambulatory Visit (INDEPENDENT_AMBULATORY_CARE_PROVIDER_SITE_OTHER): Payer: 59 | Admitting: Neurology

## 2022-03-13 ENCOUNTER — Ambulatory Visit: Payer: Self-pay | Admitting: Neurology

## 2022-03-13 DIAGNOSIS — G43709 Chronic migraine without aura, not intractable, without status migrainosus: Secondary | ICD-10-CM

## 2022-03-15 ENCOUNTER — Other Ambulatory Visit (HOSPITAL_COMMUNITY): Payer: Self-pay

## 2022-03-15 MED ORDER — PROPRANOLOL HCL ER 80 MG PO CP24
80.0000 mg | ORAL_CAPSULE | Freq: Every day | ORAL | 11 refills | Status: DC
  Filled 2022-03-15: qty 30, 30d supply, fill #0

## 2022-03-16 NOTE — Progress Notes (Signed)
cancelled

## 2022-03-29 ENCOUNTER — Ambulatory Visit: Payer: 59 | Admitting: Neurology

## 2022-03-29 ENCOUNTER — Other Ambulatory Visit (HOSPITAL_COMMUNITY): Payer: Self-pay

## 2022-03-29 ENCOUNTER — Encounter: Payer: Self-pay | Admitting: Neurology

## 2022-03-29 VITALS — BP 125/78 | HR 70 | Ht 66.0 in | Wt 180.0 lb

## 2022-03-29 DIAGNOSIS — G43709 Chronic migraine without aura, not intractable, without status migrainosus: Secondary | ICD-10-CM | POA: Diagnosis not present

## 2022-03-29 DIAGNOSIS — H9190 Unspecified hearing loss, unspecified ear: Secondary | ICD-10-CM | POA: Diagnosis not present

## 2022-03-29 MED ORDER — ALPRAZOLAM 1 MG PO TABS
ORAL_TABLET | ORAL | 0 refills | Status: DC
  Filled 2022-03-29 – 2022-04-14 (×2): qty 3, 30d supply, fill #0

## 2022-03-29 MED ORDER — PROPRANOLOL HCL ER 80 MG PO CP24
80.0000 mg | ORAL_CAPSULE | Freq: Every day | ORAL | 3 refills | Status: DC
  Filled 2022-03-29 – 2022-04-14 (×3): qty 90, 90d supply, fill #0
  Filled 2022-07-16: qty 90, 90d supply, fill #1

## 2022-03-29 NOTE — Progress Notes (Signed)
Chief Complaint  Patient presents with   Follow-up    Rm 13, alone States propranolol is working well, reports less headaches       ASSESSMENT AND PLAN  Dennis Simon is a 41 y.o. male   Chronic migraine headaches Slow onset bilateral hearing loss, increased tinnitus,  Mother died of brain aneurysm at age 77 He is worriedabout the possibility of brain structure abnormality, proceed with MRI of the brain with without contrast through internal acoustic canal order was submitted to [facility], Xanax as needed Inderal LA 80 mg as preventive medication works well for him Maxalt 10 mg as needed helps his headache    Will inform him MRI report, DIAGNOSTIC DATA (LABS, IMAGING, TESTING) - I reviewed patient records, labs, notes, testing and imaging myself where available.  From [place] in March 2023, normal CMP creatinine of 1, with exception of elevated glucose 133, A1c 6.6, LDL 119,  MEDICAL HISTORY:  Dennis Simon is a 41 year old male, seen in request by his primary care doctor Farris Has, for evaluation of chronic migraine headaches,  I reviewed and summarized the referring note. PMHX Pre-DM  I saw him in 2020 for chronic migraine headaches, which started in his late 18s, holoacranial pounding headache with light noise sensitivity, nauseous, lasting hours of days  His mother died of brain aneurysm at age 19  He was put on Topamax 100 mg twice a day as preventive medications, his headache did improve, less severe, he tried Imitrex, which works well for his headache, but after Imitrex, he often felt skin on fire  After he ran out of the prescription around 20, he relied on his over-the-counter ibuprofen as needed, reported worsening headaches since October 2022, he can have up to 15 headaches daily a month, variable degree, most severe headaches, even 5 tablets of ibuprofen would not touch it, severe pounding headache with light noise sensitivity, nauseous, occasionally  headache is preceded by decreased peripheral visual field.  Treatment for his migraine light, weather changes, heavily seasoned food,  He also complains of intermittent bilateral tinnitus, gradual onset bilateral hearing loss over the past few years  UPDATE Mar 29 2022: He had 1 major migraine headache a month ago after eating some soups, he contributed to salt load, no hip pain or attention to his food, also started inderal since November 2023, it helped his headache, tried Maxalt few times, works well  PHYSICAL EXAM:   Vitals:   03/29/22 1537  BP: 125/78  Pulse: 70  Weight: 180 lb (81.6 kg)  Height: 5\' 6"  (1.676 m)     Body mass index is 29.05 kg/m.  PHYSICAL EXAMNIATION:  Gen: NAD, conversant, well nourised, well groomed                     Cardiovascular: Regular rate rhythm, no peripheral edema, warm, nontender. Eyes: Conjunctivae clear without exudates or hemorrhage Neck: Supple, no carotid bruits. Pulmonary: Clear to auscultation bilaterally   NEUROLOGICAL EXAM:  MENTAL STATUS: Speech/cognition: Awake, alert, oriented to history taking and casual conversation CRANIAL NERVES: CN II: Visual fields are full to confrontation. Pupils are round equal and briskly reactive to light. CN III, IV, VI: extraocular movement are normal. No ptosis. CN V: Facial sensation is intact to light touch CN VII: Face is symmetric with normal eye closure  CN VIII:  , air conduction more than pulmonary conduction, Weber's test were midline CN IX, X: Phonation is normal. CN XI: Head turning and  shoulder shrug are intact  MOTOR: There is no pronator drift of out-stretched arms. Muscle bulk and tone are normal. Muscle strength is normal.  REFLEXES: Reflexes are 2+ and symmetric at the biceps, triceps, knees, and ankles. Plantar responses are flexor.  SENSORY: Intact to light touch, pinprick and vibratory sensation are intact in fingers and toes.  COORDINATION: There is no trunk or  limb dysmetria noted.  GAIT/STANCE: Posture is normal. Gait is steady with normal steps  Romberg is absent.  REVIEW OF SYSTEMS:  Full 14 system review of systems performed and notable only for as above All other review of systems were negative.   ALLERGIES: Allergies  Allergen Reactions   Hydrocodone Anaphylaxis    HOME MEDICATIONS: Current Outpatient Medications  Medication Sig Dispense Refill   propranolol ER (INDERAL LA) 80 MG 24 hr capsule Take 1 capsule (80 mg total) by mouth daily. 30 capsule 11   rizatriptan (MAXALT-MLT) 10 MG disintegrating tablet Take 1 tablet (10 mg total) by mouth as needed. May repeat in 2 hours if needed 10 tablet 6   No current facility-administered medications for this visit.    PAST MEDICAL HISTORY: Past Medical History:  Diagnosis Date   Diabetes (HCC)    Headache     PAST SURGICAL HISTORY: Past Surgical History:  Procedure Laterality Date   TONSILLECTOMY     tubes in ears      FAMILY HISTORY: Family History  Problem Relation Age of Onset   Diabetes Mother    Aneurysm Mother    Other Father        died in house fire   Cancer Maternal Grandmother        unsure of type    SOCIAL HISTORY: Social History   Socioeconomic History   Marital status: Legally Separated    Spouse name: Not on file   Number of children: 1   Years of education: 12   Highest education level: High school graduate  Occupational History   Occupation: facility maintenance tech  Tobacco Use   Smoking status: Never   Smokeless tobacco: Never  Substance and Sexual Activity   Alcohol use: No   Drug use: No   Sexual activity: Not on file  Other Topics Concern   Not on file  Social History Narrative   Lives at home alone.   Right-handed.   3-4 cups caffeine per day.   Social Determinants of Health   Financial Resource Strain: Not on file  Food Insecurity: Not on file  Transportation Needs: Not on file  Physical Activity: Not on file  Stress:  Not on file  Social Connections: Not on file  Intimate Partner Violence: Not on file      Levert Feinstein, M.D. Ph.D.  [facility] [address], [address] [place], [place] [address] Ph: [phone] Fax: [phone]  CC:  Farris Has, MD [address] [place],  [place] [address]  Farris Has, MD  -----------------------

## 2022-03-30 ENCOUNTER — Other Ambulatory Visit (HOSPITAL_COMMUNITY): Payer: Self-pay

## 2022-04-03 ENCOUNTER — Telehealth: Payer: Self-pay | Admitting: Neurology

## 2022-04-03 NOTE — Telephone Encounter (Signed)
[facility] NPR sent to [facility] [phone]

## 2022-04-11 ENCOUNTER — Other Ambulatory Visit (HOSPITAL_COMMUNITY): Payer: Self-pay

## 2022-04-14 ENCOUNTER — Other Ambulatory Visit: Payer: Self-pay

## 2022-04-14 ENCOUNTER — Other Ambulatory Visit (HOSPITAL_COMMUNITY): Payer: Self-pay

## 2022-04-17 ENCOUNTER — Other Ambulatory Visit (HOSPITAL_COMMUNITY): Payer: Self-pay

## 2022-07-17 ENCOUNTER — Other Ambulatory Visit: Payer: Self-pay

## 2022-07-27 ENCOUNTER — Other Ambulatory Visit (HOSPITAL_COMMUNITY): Payer: Self-pay

## 2022-07-27 MED ORDER — AZITHROMYCIN 250 MG PO TABS
ORAL_TABLET | ORAL | 0 refills | Status: DC
  Filled 2022-07-27: qty 6, 5d supply, fill #0

## 2022-08-22 ENCOUNTER — Other Ambulatory Visit (HOSPITAL_COMMUNITY): Payer: Self-pay

## 2022-08-22 DIAGNOSIS — R001 Bradycardia, unspecified: Secondary | ICD-10-CM | POA: Diagnosis not present

## 2022-08-22 MED ORDER — PROPRANOLOL HCL ER 60 MG PO CP24
60.0000 mg | ORAL_CAPSULE | Freq: Every day | ORAL | 0 refills | Status: DC
  Filled 2022-08-22: qty 30, 30d supply, fill #0

## 2022-08-28 ENCOUNTER — Telehealth: Payer: Self-pay | Admitting: Family Medicine

## 2022-08-28 NOTE — Telephone Encounter (Signed)
Patient would like to know if Dr. Posey Rea is willing to take him on as a new patient.  Best callback is [phone].

## 2022-08-28 NOTE — Progress Notes (Signed)
[facility] CONSULT NOTE  Referring Physician: Farris Has, MD Primary Care: Farris Has, MD Primary Cardiologist: None  HPI:  42 y/o male with chronic migraine HAs      Review of Systems: [y] = yes, [ ]  = no   General: Weight gain [ ] ; Weight loss [ ] ; Anorexia [ ] ; Fatigue [ ] ; Fever [ ] ; Chills [ ] ; Weakness [ ]   Cardiac: Chest pain/pressure [ ] ; Resting SOB [ ] ; Exertional SOB [ ] ; Orthopnea [ ] ; Pedal Edema [ ] ; Palpitations [ ] ; Syncope [ ] ; Presyncope [ ] ; Paroxysmal nocturnal dyspnea[ ]   Pulmonary: Cough [ ] ; Wheezing[ ] ; Hemoptysis[ ] ; Sputum [ ] ; Snoring [ ]   GI: Vomiting[ ] ; Dysphagia[ ] ; Melena[ ] ; Hematochezia [ ] ; Heartburn[ ] ; Abdominal pain [ ] ; Constipation [ ] ; Diarrhea [ ] ; BRBPR [ ]   GU: Hematuria[ ] ; Dysuria [ ] ; Nocturia[ ]   Vascular: Pain in legs with walking [ ] ; Pain in feet with lying flat [ ] ; Non-healing sores [ ] ; Stroke [ ] ; TIA [ ] ; Slurred speech [ ] ;  Neuro: Headaches[ ] ; Vertigo[ ] ; Seizures[ ] ; Paresthesias[ ] ;Blurred vision [ ] ; Diplopia [ ] ; Vision changes [ ]   Ortho/Skin: Arthritis [ ] ; Joint pain [ ] ; Muscle pain [ ] ; Joint swelling [ ] ; Back Pain [ ] ; Rash [ ]   Psych: Depression[ ] ; Anxiety[ ]   Heme: Bleeding problems [ ] ; Clotting disorders [ ] ; Anemia [ ]   Endocrine: Diabetes [ ] ; Thyroid dysfunction[ ]    Past Medical History:  Diagnosis Date   Diabetes (HCC)    Headache     Current Outpatient Medications  Medication Sig Dispense Refill   ALPRAZolam (XANAX) 1 MG tablet Take 1 tab at onset of migraine.  May repeat in 2 hrs, if needed.  Max dose: 2 tabs/day. This is a 30 day prescription. 3 tablet 0   azithromycin (ZITHROMAX) 250 MG tablet Take 2 tablets on day 1, then 1 tablet daily days 2 through 5 6 each 0   propranolol ER (INDERAL LA) 60 MG 24 hr capsule Take 1 capsule (60 mg total) by mouth daily. 30 capsule 0   propranolol ER (INDERAL LA) 80 MG 24 hr capsule Take 1 capsule (80 mg total) by mouth daily. 90 capsule 3    rizatriptan (MAXALT-MLT) 10 MG disintegrating tablet Take 1 tablet (10 mg total) by mouth as needed. May repeat in 2 hours if needed 10 tablet 6   No current facility-administered medications for this encounter.    Allergies  Allergen Reactions   Hydrocodone Anaphylaxis      Social History   Socioeconomic History   Marital status: Legally Separated    Spouse name: Not on file   Number of children: 1   Years of education: 12   Highest education level: High school graduate  Occupational History   Occupation: facility maintenance tech  Tobacco Use   Smoking status: Never   Smokeless tobacco: Never  Substance and Sexual Activity   Alcohol use: No   Drug use: No   Sexual activity: Not on file  Other Topics Concern   Not on file  Social History Narrative   Lives at home alone.   Right-handed.   3-4 cups caffeine per day.   Social Determinants of Health   Financial Resource Strain: Not on file  Food Insecurity: Not on file  Transportation Needs: Not on file  Physical Activity: Not on file  Stress: Not on file  Social Connections: Not on  file  Intimate Partner Violence: Not on file      Family History  Problem Relation Age of Onset   Diabetes Mother    Aneurysm Mother    Other Father        died in house fire   Cancer Maternal Grandmother        unsure of type    There were no vitals filed for this visit.  PHYSICAL EXAM: General:  Well appearing. No respiratory difficulty HEENT: normal Neck: supple. no JVD. Carotids 2+ bilat; no bruits. No lymphadenopathy or thryomegaly appreciated. Cor: PMI nondisplaced. Regular rate & rhythm. No rubs, gallops or murmurs. Lungs: clear Abdomen: soft, nontender, nondistended. No hepatosplenomegaly. No bruits or masses. Good bowel sounds. Extremities: no cyanosis, clubbing, rash, edema Neuro: alert & oriented x 3, cranial nerves grossly intact. moves all 4 extremities w/o difficulty. Affect pleasant.  ECG:   ASSESSMENT &  PLAN:   Arvilla Meres, MD  1:19 PM

## 2022-08-29 ENCOUNTER — Ambulatory Visit (HOSPITAL_COMMUNITY)
Admission: RE | Admit: 2022-08-29 | Discharge: 2022-08-29 | Disposition: A | Discharge status: Home / Self Care | Payer: Commercial Managed Care - PPO | Source: Ambulatory Visit | Attending: Internal Medicine | Admitting: Internal Medicine

## 2022-08-29 ENCOUNTER — Encounter (HOSPITAL_COMMUNITY): Payer: Self-pay | Admitting: Internal Medicine

## 2022-08-29 VITALS — BP 120/80 | HR 52 | Wt 180.8 lb

## 2022-08-29 DIAGNOSIS — Z79899 Other long term (current) drug therapy: Secondary | ICD-10-CM | POA: Insufficient documentation

## 2022-08-29 DIAGNOSIS — Z9189 Other specified personal risk factors, not elsewhere classified: Secondary | ICD-10-CM

## 2022-08-29 DIAGNOSIS — R0609 Other forms of dyspnea: Secondary | ICD-10-CM | POA: Diagnosis not present

## 2022-08-29 DIAGNOSIS — R079 Chest pain, unspecified: Secondary | ICD-10-CM | POA: Diagnosis not present

## 2022-08-29 DIAGNOSIS — I10 Essential (primary) hypertension: Secondary | ICD-10-CM

## 2022-08-29 DIAGNOSIS — E785 Hyperlipidemia, unspecified: Secondary | ICD-10-CM | POA: Insufficient documentation

## 2022-08-29 DIAGNOSIS — E119 Type 2 diabetes mellitus without complications: Secondary | ICD-10-CM | POA: Diagnosis not present

## 2022-08-29 NOTE — Patient Instructions (Signed)
No changes to medications.  Your physician has requested that you have an echocardiogram. Echocardiography is a painless test that uses sound waves to create images of your heart. It provides your doctor with information about the size and shape of your heart and how well your heart's chambers and valves are working. This procedure takes approximately one hour. There are no restrictions for this procedure. Please do NOT wear cologne, perfume, aftershave, or lotions (deodorant is allowed). Please arrive 15 minutes prior to your appointment time.  Calcium score test has been ordered for you. This is a self pay test.  Your physician recommends that you schedule a follow-up appointment in: as needed   If you have any questions or concerns before your next appointment please send [place] a message through [place] or call our office at [phone].    TO LEAVE A MESSAGE FOR THE NURSE SELECT OPTION 2, PLEASE LEAVE A MESSAGE INCLUDING: YOUR NAME DATE OF BIRTH CALL BACK NUMBER REASON FOR CALL**this is important as we prioritize the call backs  YOU WILL RECEIVE A CALL BACK THE SAME DAY AS LONG AS YOU CALL BEFORE 4:00 PM  At the [facility], you and your health needs are our priority. As part of our continuing mission to provide you with exceptional heart care, we have created designated Provider Care Teams. These Care Teams include your primary Cardiologist (physician) and Advanced Practice Providers (APPs- Physician Assistants and Nurse Practitioners) who all work together to provide you with the care you need, when you need it.   You may see any of the following providers on your designated Care Team at your next follow up: Dr Arvilla Meres Dr Marca Ancona Dr. Marcos Eke, NP Robbie Lis, [place] [facility], [place] Brynda Peon, NP Karle Plumber, PharmD   Please be sure to bring in all your medications bottles to every appointment.    Thank  you for choosing [facility]

## 2022-08-29 NOTE — Telephone Encounter (Signed)
Okay. Thank you.

## 2022-09-05 ENCOUNTER — Ambulatory Visit (HOSPITAL_COMMUNITY)
Admission: RE | Admit: 2022-09-05 | Discharge: 2022-09-05 | Disposition: A | Discharge status: Home / Self Care | Payer: Self-pay | Source: Ambulatory Visit | Attending: Internal Medicine | Admitting: Internal Medicine

## 2022-09-05 DIAGNOSIS — R079 Chest pain, unspecified: Secondary | ICD-10-CM

## 2022-09-10 ENCOUNTER — Encounter: Payer: Self-pay | Admitting: Neurology

## 2022-09-14 ENCOUNTER — Ambulatory Visit (HOSPITAL_COMMUNITY)
Admission: RE | Admit: 2022-09-14 | Discharge: 2022-09-14 | Disposition: A | Discharge status: Home / Self Care | Payer: Commercial Managed Care - PPO | Source: Ambulatory Visit | Attending: Neurology | Admitting: Neurology

## 2022-09-14 ENCOUNTER — Other Ambulatory Visit (HOSPITAL_COMMUNITY): Payer: Self-pay

## 2022-09-14 DIAGNOSIS — G43709 Chronic migraine without aura, not intractable, without status migrainosus: Secondary | ICD-10-CM | POA: Diagnosis not present

## 2022-09-14 DIAGNOSIS — H9190 Unspecified hearing loss, unspecified ear: Secondary | ICD-10-CM | POA: Insufficient documentation

## 2022-09-14 MED ORDER — GADOBUTROL 1 MMOL/ML IV SOLN
7.5000 mL | Freq: Once | INTRAVENOUS | Status: AC | PRN
  Administered 2022-09-14: 7.5 mL via INTRAVENOUS

## 2022-09-15 ENCOUNTER — Encounter: Payer: Self-pay | Admitting: Internal Medicine

## 2022-09-15 DIAGNOSIS — Z0000 Encounter for general adult medical examination without abnormal findings: Secondary | ICD-10-CM

## 2022-09-15 DIAGNOSIS — Z125 Encounter for screening for malignant neoplasm of prostate: Secondary | ICD-10-CM

## 2022-09-18 ENCOUNTER — Other Ambulatory Visit (INDEPENDENT_AMBULATORY_CARE_PROVIDER_SITE_OTHER): Payer: Commercial Managed Care - PPO

## 2022-09-18 DIAGNOSIS — Z125 Encounter for screening for malignant neoplasm of prostate: Secondary | ICD-10-CM

## 2022-09-18 DIAGNOSIS — Z0000 Encounter for general adult medical examination without abnormal findings: Secondary | ICD-10-CM

## 2022-09-18 LAB — COMPREHENSIVE METABOLIC PANEL
ALT: 9 U/L (ref 0–53)
AST: 17 U/L (ref 0–37)
Albumin: 4.5 g/dL (ref 3.5–5.2)
Alkaline Phosphatase: 66 U/L (ref 39–117)
BUN: 6 mg/dL (ref 6–23)
CO2: 28 mEq/L (ref 19–32)
Calcium: 9.2 mg/dL (ref 8.4–10.5)
Chloride: 101 mEq/L (ref 96–112)
Creatinine, Ser: 1.04 mg/dL (ref 0.40–1.50)
GFR: 88.45 mL/min (ref >60.00)
Glucose, Bld: 115 mg/dL — ABNORMAL HIGH (ref 70–99)
Potassium: 4.6 mEq/L (ref 3.5–5.1)
Sodium: 139 mEq/L (ref 135–145)
Total Bilirubin: 0.5 mg/dL (ref 0.2–1.2)
Total Protein: 6.9 g/dL (ref 6.0–8.3)

## 2022-09-18 LAB — URINALYSIS, ROUTINE W REFLEX MICROSCOPIC
Bilirubin Urine: NEGATIVE
Hgb urine dipstick: NEGATIVE
Ketones, ur: NEGATIVE
Leukocytes,Ua: NEGATIVE
Nitrite: NEGATIVE
RBC / HPF: NONE SEEN (ref 0–?)
Specific Gravity, Urine: <=1.005 — AB (ref 1.000–1.030)
Total Protein, Urine: NEGATIVE
Urine Glucose: NEGATIVE
Urobilinogen, UA: 0.2 (ref 0.0–1.0)
WBC, UA: NONE SEEN (ref 0–?)
pH: 7 (ref 5.0–8.0)

## 2022-09-18 LAB — PSA: PSA: 0.92 ng/mL (ref 0.10–4.00)

## 2022-09-19 ENCOUNTER — Other Ambulatory Visit (HOSPITAL_COMMUNITY): Payer: Self-pay

## 2022-09-19 MED ORDER — PROPRANOLOL HCL ER 60 MG PO CP24
60.0000 mg | ORAL_CAPSULE | Freq: Every day | ORAL | 0 refills | Status: DC
  Filled 2022-09-19: qty 30, 30d supply, fill #0

## 2022-09-20 ENCOUNTER — Other Ambulatory Visit (HOSPITAL_COMMUNITY): Payer: Self-pay

## 2022-09-20 ENCOUNTER — Ambulatory Visit: Payer: Commercial Managed Care - PPO | Admitting: Internal Medicine

## 2022-09-20 ENCOUNTER — Encounter: Payer: Self-pay | Admitting: Internal Medicine

## 2022-09-20 VITALS — BP 130/84 | HR 66 | Temp 98.7°F | Ht 66.0 in | Wt 178.0 lb

## 2022-09-20 DIAGNOSIS — J309 Allergic rhinitis, unspecified: Secondary | ICD-10-CM | POA: Insufficient documentation

## 2022-09-20 DIAGNOSIS — J301 Allergic rhinitis due to pollen: Secondary | ICD-10-CM

## 2022-09-20 DIAGNOSIS — R42 Dizziness and giddiness: Secondary | ICD-10-CM

## 2022-09-20 DIAGNOSIS — R739 Hyperglycemia, unspecified: Secondary | ICD-10-CM

## 2022-09-20 DIAGNOSIS — J45990 Exercise induced bronchospasm: Secondary | ICD-10-CM | POA: Diagnosis not present

## 2022-09-20 DIAGNOSIS — Z0001 Encounter for general adult medical examination with abnormal findings: Secondary | ICD-10-CM | POA: Diagnosis not present

## 2022-09-20 DIAGNOSIS — R55 Syncope and collapse: Secondary | ICD-10-CM | POA: Diagnosis not present

## 2022-09-20 DIAGNOSIS — E1165 Type 2 diabetes mellitus with hyperglycemia: Secondary | ICD-10-CM

## 2022-09-20 DIAGNOSIS — F40240 Claustrophobia: Secondary | ICD-10-CM | POA: Diagnosis not present

## 2022-09-20 DIAGNOSIS — Z0000 Encounter for general adult medical examination without abnormal findings: Secondary | ICD-10-CM

## 2022-09-20 DIAGNOSIS — E119 Type 2 diabetes mellitus without complications: Secondary | ICD-10-CM

## 2022-09-20 MED ORDER — ALBUTEROL SULFATE HFA 108 (90 BASE) MCG/ACT IN AERS
2.0000 | INHALATION_SPRAY | RESPIRATORY_TRACT | 3 refills | Status: AC | PRN
  Filled 2022-09-20: qty 6.7, 25d supply, fill #0

## 2022-09-20 MED ORDER — LORATADINE 10 MG PO TABS
10.0000 mg | ORAL_TABLET | Freq: Every day | ORAL | 3 refills | Status: DC
  Filled 2022-09-20: qty 90, 90d supply, fill #0
  Filled 2022-09-20: qty 100, 100d supply, fill #0

## 2022-09-20 NOTE — Assessment & Plan Note (Signed)
Diet controlled.  Check A1c.

## 2022-09-20 NOTE — Progress Notes (Signed)
Subjective:  Patient ID: Dennis Simon, male    DOB: Sep 05, 1980  Age: 42 y.o. MRN: [id number]  CC: New Patient (Initial Visit)   HPI Dennis Simon presents for a well exam.  He is new patient C/o  HTN, near-syncope, vertigo C/o DM Pt stopped Propranolol 1 month ago due to low HR, start to feel better.  The headaches did not come back M died at 35 of brain aneurism-she is worried H/o asthma, mostly exercise-induced, infrequent Other family history is unknown  Outpatient Medications Prior to Visit  Medication Sig Dispense Refill   rizatriptan (MAXALT-MLT) 10 MG disintegrating tablet Take 1 tablet (10 mg total) by mouth as needed. May repeat in 2 hours if needed (Patient not taking: Reported on 09/20/2022) 10 tablet 6   propranolol ER (INDERAL LA) 60 MG 24 hr capsule Take 1 capsule (60 mg total) by mouth daily. (Patient not taking: Reported on 09/20/2022) 30 capsule 0   No facility-administered medications prior to visit.    ROS: Review of Systems  Constitutional:  Negative for appetite change, fatigue and unexpected weight change.  HENT:  Negative for congestion, nosebleeds, sneezing, sore throat and trouble swallowing.   Eyes:  Negative for itching and visual disturbance.  Respiratory:  Negative for cough.   Cardiovascular:  Negative for chest pain, palpitations and leg swelling.  Gastrointestinal:  Negative for abdominal distention, blood in stool, diarrhea and nausea.  Genitourinary:  Negative for frequency and hematuria.  Musculoskeletal:  Negative for back pain, gait problem, joint swelling and neck pain.  Skin:  Negative for rash.  Neurological:  Negative for dizziness, tremors, speech difficulty and weakness.  Psychiatric/Behavioral:  Negative for agitation, dysphoric mood and sleep disturbance. The patient is not nervous/anxious.     Objective:  BP 130/84 (BP Location: Left Arm, Patient Position: Sitting, Cuff Size: Large)   Pulse 66   Temp 98.7 F (37.1 C)  (Oral)   Ht 5\' 6"  (1.676 m)   Wt 178 lb (80.7 kg)   SpO2 99%   BMI 28.73 kg/m   BP Readings from Last 3 Encounters:  09/20/22 130/84  08/29/22 120/80  03/29/22 125/78    Wt Readings from Last 3 Encounters:  09/20/22 178 lb (80.7 kg)  08/29/22 180 lb 12.8 oz (82 kg)  03/29/22 180 lb (81.6 kg)    Physical Exam Constitutional:      General: He is not in acute distress.    Appearance: Normal appearance. He is well-developed.     Comments: NAD  Eyes:     Conjunctiva/sclera: Conjunctivae normal.     Pupils: Pupils are equal, round, and reactive to light.  Neck:     Thyroid: No thyromegaly.     Vascular: No JVD.  Cardiovascular:     Rate and Rhythm: Normal rate and regular rhythm.     Heart sounds: Normal heart sounds. No murmur heard.    No friction rub. No gallop.  Pulmonary:     Effort: Pulmonary effort is normal. No respiratory distress.     Breath sounds: Normal breath sounds. No wheezing or rales.  Chest:     Chest wall: No tenderness.  Abdominal:     General: Bowel sounds are normal. There is no distension.     Palpations: Abdomen is soft. There is no mass.     Tenderness: There is no abdominal tenderness. There is no guarding or rebound.  Musculoskeletal:        General: No tenderness. Normal range of motion.  Cervical back: Normal range of motion.  Lymphadenopathy:     Cervical: No cervical adenopathy.  Skin:    General: Skin is warm and dry.     Findings: No rash.  Neurological:     Mental Status: He is alert and oriented to person, place, and time.     Cranial Nerves: No cranial nerve deficit.     Motor: No abnormal muscle tone.     Coordination: Coordination normal.     Gait: Gait normal.     Deep Tendon Reflexes: Reflexes are normal and symmetric.  Psychiatric:        Behavior: Behavior normal.        Thought Content: Thought content normal.        Judgment: Judgment normal.     Lab Results  Component Value Date   GLUCOSE 115 (H) 09/18/2022    ALT 9 09/18/2022   AST 17 09/18/2022   NA 139 09/18/2022   K 4.6 09/18/2022   CL 101 09/18/2022   CREATININE 1.04 09/18/2022   BUN 6 09/18/2022   CO2 28 09/18/2022   PSA 0.92 09/18/2022    MR BRAIN/IAC W WO CONTRAST  Result Date: 09/14/2022 CLINICAL DATA:  Provided history: Chronic migraine without aura without status migrainosus, not intractable. Hearing loss, unspecified hearing loss type, unspecified laterality. Additional history provided by the scanning technologist: The patient reports slow onset of bilateral hearing loss, tinnitus. EXAM: MRI HEAD WITHOUT AND WITH CONTRAST TECHNIQUE: Multiplanar, multiecho pulse sequences of the brain and surrounding structures were obtained without and with intravenous contrast. CONTRAST:  7.11mL GADAVIST GADOBUTROL 1 MMOL/ML IV SOLN COMPARISON:  No pertinent prior exams available for comparison. FINDINGS: Brain: No age advanced or lobar predominant parenchymal atrophy. No cortical encephalomalacia is identified. No significant cerebral white matter disease. No evidence of an intracranial mass. Specifically, no cerebellopontine angle or internal auditory canal mass is demonstrated. Unremarkable appearance of the 7th and 8th cranial nerves bilaterally. There is no acute infarct. No chronic intracranial blood products. No extra-axial fluid collection. No midline shift. No pathologic intracranial enhancement identified. Vascular: Maintained flow voids within the proximal large arterial vessels. Skull and upper cervical spine: No focal suspicious marrow lesion. Sinuses/Orbits: No mass or acute finding within the imaged orbits. No significant paranasal sinus disease. IMPRESSION: 1. Unremarkable MRI appearance of the brain. No evidence of an acute intracranial abnormality. 2. No cerebellopontine angle or internal auditory canal mass. 3. No specific cause of hearing loss or tinnitus is identified. Electronically Signed   By: Jackey Loge D.O.   On: 09/14/2022 13:26     Assessment & Plan:   Problem List Items Addressed This Visit     Vertigo    He is seen Dr Terrace Arabia Start taking loratidine 10 mg daily Family history of brain aneurysm-mother died at the age of 62.  Brain MRI was normal      Near syncope - Primary    The patient is seeing Dr. Gala Romney. Cardiac echo is pending Coronary calcium CT is 0. Brain MRI was normal Continue with good hydration      Relevant Orders   Comprehensive metabolic panel   CBC with Differential/Platelet   Vitamin B12   VITAMIN D 25 Hydroxy (Vit-D Deficiency, Fractures)   Microalbumin / creatinine urine ratio   Urinalysis   Hemoglobin A1c   Claustrophobia    MRI, elevator The patient needed Xanax while getting an MRI Otherwise he is doing fine      Allergic rhinitis  Start taking loratidine 10 mg daily      Exercise-induced asthma    Ventolin prn      Relevant Medications   albuterol (VENTOLIN HFA) 108 (90 Base) MCG/ACT inhaler   Other Relevant Orders   Comprehensive metabolic panel   CBC with Differential/Platelet   Vitamin B12   VITAMIN D 25 Hydroxy (Vit-D Deficiency, Fractures)   Microalbumin / creatinine urine ratio   Urinalysis   Hemoglobin A1c   Well adult exam     We discussed age appropriate health related issues, including available/recomended screening tests and vaccinations. Labs were ordered to be later reviewed . All questions were answered. We discussed one or more of the following - seat belt use, use of sunscreen/sun exposure exercise, fall risk reduction, second hand smoke exposure, firearm use and storage, seat belt use, a need for adhering to healthy diet and exercise. Labs were ordered.  All questions were answered.       Diabetes type 2, controlled (HCC)    Diet controlled Check A1c      Other Visit Diagnoses     Hyperglycemia       Relevant Orders   Microalbumin / creatinine urine ratio   Hemoglobin A1c         Meds ordered this encounter  Medications    loratadine (CLARITIN) 10 MG tablet    Sig: Take 1 tablet (10 mg total) by mouth daily.    Dispense:  100 tablet    Refill:  3   albuterol (VENTOLIN HFA) 108 (90 Base) MCG/ACT inhaler    Sig: Inhale 2 puffs into the lungs every 4 (four) hours as needed for wheezing or shortness of breath.    Dispense:  6.7 g    Refill:  3      Follow-up: Return in about 3 months (around 12/21/2022) for Wellness Exam.  Sonda Primes, MD

## 2022-09-20 NOTE — Assessment & Plan Note (Signed)
Continue to use Maxalt as needed

## 2022-09-20 NOTE — Assessment & Plan Note (Addendum)
He is seen Dr Terrace Arabia Start taking loratidine 10 mg daily Family history of brain aneurysm-mother died at the age of 32.  Brain MRI was normal

## 2022-09-20 NOTE — Assessment & Plan Note (Signed)
Ventolin prn 

## 2022-09-20 NOTE — Assessment & Plan Note (Signed)
The patient is seeing Dr. Gala Romney. Cardiac echo is pending Coronary calcium CT is 0. Brain MRI was normal Continue with good hydration

## 2022-09-20 NOTE — Assessment & Plan Note (Addendum)
Start taking loratidine 10 mg daily

## 2022-09-20 NOTE — Assessment & Plan Note (Signed)

## 2022-09-20 NOTE — Assessment & Plan Note (Addendum)
MRI, elevator The patient needed Xanax while getting an MRI Otherwise he is doing fine

## 2022-09-21 ENCOUNTER — Ambulatory Visit (HOSPITAL_COMMUNITY): Payer: Commercial Managed Care - PPO

## 2022-09-25 ENCOUNTER — Encounter: Payer: Self-pay | Admitting: Internal Medicine

## 2022-09-26 ENCOUNTER — Encounter: Payer: Self-pay | Admitting: Internal Medicine

## 2022-09-26 ENCOUNTER — Other Ambulatory Visit (HOSPITAL_COMMUNITY): Payer: Self-pay

## 2022-09-26 ENCOUNTER — Ambulatory Visit: Payer: Commercial Managed Care - PPO | Admitting: Internal Medicine

## 2022-09-26 VITALS — BP 140/98 | HR 55 | Temp 98.7°F | Ht 66.0 in | Wt 177.0 lb

## 2022-09-26 DIAGNOSIS — E119 Type 2 diabetes mellitus without complications: Secondary | ICD-10-CM | POA: Diagnosis not present

## 2022-09-26 DIAGNOSIS — G43919 Migraine, unspecified, intractable, without status migrainosus: Secondary | ICD-10-CM | POA: Diagnosis not present

## 2022-09-26 DIAGNOSIS — I10 Essential (primary) hypertension: Secondary | ICD-10-CM | POA: Diagnosis not present

## 2022-09-26 DIAGNOSIS — R55 Syncope and collapse: Secondary | ICD-10-CM

## 2022-09-26 MED ORDER — VALSARTAN 80 MG PO TABS
80.0000 mg | ORAL_TABLET | Freq: Every day | ORAL | 3 refills | Status: DC
  Filled 2022-09-26: qty 90, 90d supply, fill #0
  Filled 2022-12-25: qty 90, 90d supply, fill #1

## 2022-09-26 NOTE — Assessment & Plan Note (Signed)
Diet controlled.  Check A1c.

## 2022-09-26 NOTE — Progress Notes (Signed)
Subjective:  Patient ID: Dennis Simon, male    DOB: 06-12-1980  Age: 42 y.o. MRN: [id number]  CC: Hypertension (Feeling weak,light headed, dizzy and headache, low energy)   HPI Dennis Simon presents for HTN SBP 169 at the dentist's C/o sinus drainage - brown   Outpatient Medications Prior to Visit  Medication Sig Dispense Refill   albuterol (VENTOLIN HFA) 108 (90 Base) MCG/ACT inhaler Inhale 2 puffs into the lungs every 4 (four) hours as needed for wheezing or shortness of breath. 6.7 g 3   loratadine (CLARITIN) 10 MG tablet Take 1 tablet (10 mg total) by mouth daily. 100 tablet 3   rizatriptan (MAXALT-MLT) 10 MG disintegrating tablet Take 1 tablet (10 mg total) by mouth as needed. May repeat in 2 hours if needed (Patient not taking: Reported on 09/20/2022) 10 tablet 6   No facility-administered medications prior to visit.    ROS: Review of Systems  Constitutional:  Negative for appetite change, fatigue and unexpected weight change.  HENT:  Negative for congestion, nosebleeds, sneezing, sore throat and trouble swallowing.   Eyes:  Negative for itching and visual disturbance.  Respiratory:  Negative for cough.   Cardiovascular:  Negative for chest pain, palpitations and leg swelling.  Gastrointestinal:  Negative for abdominal distention, blood in stool, diarrhea and nausea.  Genitourinary:  Negative for frequency and hematuria.  Musculoskeletal:  Negative for back pain, gait problem, joint swelling and neck pain.  Skin:  Negative for rash.  Neurological:  Negative for dizziness, tremors, speech difficulty and weakness.  Psychiatric/Behavioral:  Negative for agitation, dysphoric mood and sleep disturbance. The patient is not nervous/anxious.     Objective:  BP (!) 140/98 (BP Location: Left Arm, Patient Position: Sitting, Cuff Size: Normal)   Pulse (!) 55   Temp 98.7 F (37.1 C) (Oral)   Ht 5\' 6"  (1.676 m)   Wt 177 lb (80.3 kg)   SpO2 100%   BMI 28.57 kg/m   BP  Readings from Last 3 Encounters:  09/26/22 (!) 140/98  09/20/22 130/84  08/29/22 120/80    Wt Readings from Last 3 Encounters:  09/26/22 177 lb (80.3 kg)  09/20/22 178 lb (80.7 kg)  08/29/22 180 lb 12.8 oz (82 kg)    Physical Exam Constitutional:      General: He is not in acute distress.    Appearance: He is well-developed.     Comments: NAD  Eyes:     Conjunctiva/sclera: Conjunctivae normal.     Pupils: Pupils are equal, round, and reactive to light.  Neck:     Thyroid: No thyromegaly.     Vascular: No JVD.  Cardiovascular:     Rate and Rhythm: Normal rate and regular rhythm.     Heart sounds: Normal heart sounds. No murmur heard.    No friction rub. No gallop.  Pulmonary:     Effort: Pulmonary effort is normal. No respiratory distress.     Breath sounds: Normal breath sounds. No wheezing or rales.  Chest:     Chest wall: No tenderness.  Abdominal:     General: Bowel sounds are normal. There is no distension.     Palpations: Abdomen is soft. There is no mass.     Tenderness: There is no abdominal tenderness. There is no guarding or rebound.  Musculoskeletal:        General: No tenderness. Normal range of motion.     Cervical back: Normal range of motion.  Lymphadenopathy:  Cervical: No cervical adenopathy.  Skin:    General: Skin is warm and dry.     Findings: No rash.  Neurological:     Mental Status: He is alert and oriented to person, place, and time.     Cranial Nerves: No cranial nerve deficit.     Motor: No abnormal muscle tone.     Coordination: Coordination normal.     Gait: Gait normal.     Deep Tendon Reflexes: Reflexes are normal and symmetric.  Psychiatric:        Behavior: Behavior normal.        Thought Content: Thought content normal.        Judgment: Judgment normal.     Lab Results  Component Value Date   GLUCOSE 115 (H) 09/18/2022   ALT 9 09/18/2022   AST 17 09/18/2022   NA 139 09/18/2022   K 4.6 09/18/2022   CL 101 09/18/2022    CREATININE 1.04 09/18/2022   BUN 6 09/18/2022   CO2 28 09/18/2022   PSA 0.92 09/18/2022    MR BRAIN/IAC W WO CONTRAST  Result Date: 09/14/2022 CLINICAL DATA:  Provided history: Chronic migraine without aura without status migrainosus, not intractable. Hearing loss, unspecified hearing loss type, unspecified laterality. Additional history provided by the scanning technologist: The patient reports slow onset of bilateral hearing loss, tinnitus. EXAM: MRI HEAD WITHOUT AND WITH CONTRAST TECHNIQUE: Multiplanar, multiecho pulse sequences of the brain and surrounding structures were obtained without and with intravenous contrast. CONTRAST:  7.68mL GADAVIST GADOBUTROL 1 MMOL/ML IV SOLN COMPARISON:  No pertinent prior exams available for comparison. FINDINGS: Brain: No age advanced or lobar predominant parenchymal atrophy. No cortical encephalomalacia is identified. No significant cerebral white matter disease. No evidence of an intracranial mass. Specifically, no cerebellopontine angle or internal auditory canal mass is demonstrated. Unremarkable appearance of the 7th and 8th cranial nerves bilaterally. There is no acute infarct. No chronic intracranial blood products. No extra-axial fluid collection. No midline shift. No pathologic intracranial enhancement identified. Vascular: Maintained flow voids within the proximal large arterial vessels. Skull and upper cervical spine: No focal suspicious marrow lesion. Sinuses/Orbits: No mass or acute finding within the imaged orbits. No significant paranasal sinus disease. IMPRESSION: 1. Unremarkable MRI appearance of the brain. No evidence of an acute intracranial abnormality. 2. No cerebellopontine angle or internal auditory canal mass. 3. No specific cause of hearing loss or tinnitus is identified. Electronically Signed   By: Jackey Loge D.O.   On: 09/14/2022 13:26    Assessment & Plan:   Problem List Items Addressed This Visit     Migraine headache    Continue to  use Maxalt as needed      Relevant Medications   valsartan (DIOVAN) 80 MG tablet   Near syncope - Primary    No relapse      Relevant Medications   valsartan (DIOVAN) 80 MG tablet   Diabetes type 2, controlled (HCC)    Diet controlled Check A1c      Relevant Medications   valsartan (DIOVAN) 80 MG tablet   HTN (hypertension)    Start Diovan, low dose      Relevant Medications   valsartan (DIOVAN) 80 MG tablet      Meds ordered this encounter  Medications   valsartan (DIOVAN) 80 MG tablet    Sig: Take 1 tablet (80 mg total) by mouth daily.    Dispense:  90 tablet    Refill:  3  Follow-up: Return in about 6 weeks (around 11/07/2022) for a follow-up visit.  Sonda Primes, MD

## 2022-09-26 NOTE — Assessment & Plan Note (Signed)
No relapse 

## 2022-09-26 NOTE — Assessment & Plan Note (Signed)
Start Diovan, low dose

## 2022-09-26 NOTE — Assessment & Plan Note (Signed)
Continue to use Maxalt as needed 

## 2022-10-02 ENCOUNTER — Encounter: Payer: Self-pay | Admitting: Internal Medicine

## 2022-10-02 DIAGNOSIS — Z0000 Encounter for general adult medical examination without abnormal findings: Secondary | ICD-10-CM

## 2022-10-02 NOTE — Telephone Encounter (Signed)
FYI on BP

## 2022-10-02 NOTE — Addendum Note (Signed)
Addended by: Deatra James on: 10/02/2022 11:19 AM   Modules accepted: Orders

## 2022-10-03 ENCOUNTER — Other Ambulatory Visit (INDEPENDENT_AMBULATORY_CARE_PROVIDER_SITE_OTHER): Payer: Commercial Managed Care - PPO

## 2022-10-03 DIAGNOSIS — Z0000 Encounter for general adult medical examination without abnormal findings: Secondary | ICD-10-CM

## 2022-10-03 DIAGNOSIS — R55 Syncope and collapse: Secondary | ICD-10-CM

## 2022-10-03 DIAGNOSIS — J45990 Exercise induced bronchospasm: Secondary | ICD-10-CM | POA: Diagnosis not present

## 2022-10-03 DIAGNOSIS — R739 Hyperglycemia, unspecified: Secondary | ICD-10-CM | POA: Diagnosis not present

## 2022-10-03 LAB — COMPREHENSIVE METABOLIC PANEL
ALT: 12 U/L (ref 0–53)
AST: 19 U/L (ref 0–37)
Albumin: 4.7 g/dL (ref 3.5–5.2)
Alkaline Phosphatase: 60 U/L (ref 39–117)
BUN: 10 mg/dL (ref 6–23)
CO2: 29 mEq/L (ref 19–32)
Calcium: 9.4 mg/dL (ref 8.4–10.5)
Chloride: 99 mEq/L (ref 96–112)
Creatinine, Ser: 1.01 mg/dL (ref 0.40–1.50)
GFR: 91.59 mL/min (ref >60.00)
Glucose, Bld: 101 mg/dL — ABNORMAL HIGH (ref 70–99)
Potassium: 4.6 mEq/L (ref 3.5–5.1)
Sodium: 136 mEq/L (ref 135–145)
Total Bilirubin: 0.7 mg/dL (ref 0.2–1.2)
Total Protein: 7.6 g/dL (ref 6.0–8.3)

## 2022-10-03 LAB — URINALYSIS
Bilirubin Urine: NEGATIVE
Hgb urine dipstick: NEGATIVE
Ketones, ur: NEGATIVE
Leukocytes,Ua: NEGATIVE
Nitrite: NEGATIVE
Specific Gravity, Urine: <=1.005 — AB (ref 1.000–1.030)
Total Protein, Urine: NEGATIVE
Urine Glucose: NEGATIVE
Urobilinogen, UA: 0.2 (ref 0.0–1.0)
pH: 7 (ref 5.0–8.0)

## 2022-10-03 LAB — CBC WITH DIFFERENTIAL/PLATELET
Basophils Absolute: 0 10*3/uL (ref 0.0–0.1)
Basophils Relative: 0.8 % (ref 0.0–3.0)
Eosinophils Absolute: 0.1 10*3/uL (ref 0.0–0.7)
Eosinophils Relative: 2.2 % (ref 0.0–5.0)
HCT: 44.5 % (ref 39.0–52.0)
Hemoglobin: 14.6 g/dL (ref 13.0–17.0)
Lymphocytes Relative: 26.2 % (ref 12.0–46.0)
Lymphs Abs: 1.2 10*3/uL (ref 0.7–4.0)
MCHC: 32.8 g/dL (ref 30.0–36.0)
MCV: 87.7 fl (ref 78.0–100.0)
Monocytes Absolute: 0.4 10*3/uL (ref 0.1–1.0)
Monocytes Relative: 8.8 % (ref 3.0–12.0)
Neutro Abs: 2.8 10*3/uL (ref 1.4–7.7)
Neutrophils Relative %: 62 % (ref 43.0–77.0)
Platelets: 268 10*3/uL (ref 150.0–400.0)
RBC: 5.08 Mil/uL (ref 4.22–5.81)
RDW: 13 % (ref 11.5–15.5)
WBC: 4.4 10*3/uL (ref 4.0–10.5)

## 2022-10-03 LAB — MICROALBUMIN / CREATININE URINE RATIO
Creatinine,U: 20.4 mg/dL
Microalb Creat Ratio: 3.4 mg/g (ref 0.0–30.0)
Microalb, Ur: <0.7 mg/dL (ref 0.0–1.9)

## 2022-10-03 LAB — LIPID PANEL
Cholesterol: 180 mg/dL (ref 0–200)
HDL: 36.5 mg/dL — ABNORMAL LOW (ref >39.00)
LDL Cholesterol: 119 mg/dL — ABNORMAL HIGH (ref 0–99)
NonHDL: 143.78
Total CHOL/HDL Ratio: 5
Triglycerides: 122 mg/dL (ref 0.0–149.0)
VLDL: 24.4 mg/dL (ref 0.0–40.0)

## 2022-10-03 LAB — VITAMIN D 25 HYDROXY (VIT D DEFICIENCY, FRACTURES): VITD: 19.17 ng/mL — ABNORMAL LOW (ref 30.00–100.00)

## 2022-10-03 LAB — HEMOGLOBIN A1C: Hgb A1c MFr Bld: 6.3 % (ref 4.6–6.5)

## 2022-10-03 LAB — TSH: TSH: 1.85 u[IU]/mL (ref 0.35–5.50)

## 2022-10-03 LAB — VITAMIN B12: Vitamin B-12: 201 pg/mL — ABNORMAL LOW (ref 211–911)

## 2022-10-04 ENCOUNTER — Other Ambulatory Visit: Payer: Self-pay | Admitting: Internal Medicine

## 2022-10-04 ENCOUNTER — Ambulatory Visit (INDEPENDENT_AMBULATORY_CARE_PROVIDER_SITE_OTHER): Payer: Commercial Managed Care - PPO | Admitting: *Deleted

## 2022-10-04 ENCOUNTER — Other Ambulatory Visit (HOSPITAL_COMMUNITY): Payer: Self-pay

## 2022-10-04 ENCOUNTER — Encounter: Payer: Self-pay | Admitting: Internal Medicine

## 2022-10-04 DIAGNOSIS — E559 Vitamin D deficiency, unspecified: Secondary | ICD-10-CM | POA: Insufficient documentation

## 2022-10-04 DIAGNOSIS — E538 Deficiency of other specified B group vitamins: Secondary | ICD-10-CM

## 2022-10-04 MED ORDER — CYANOCOBALAMIN 1000 MCG/ML IJ SOLN
1000.0000 ug | Freq: Once | INTRAMUSCULAR | Status: AC
Start: 2022-10-04 — End: 2022-10-04
  Administered 2022-10-04: 1000 ug via INTRAMUSCULAR

## 2022-10-04 MED ORDER — VITAMIN D (ERGOCALCIFEROL) 1.25 MG (50000 UNIT) PO CAPS
50000.0000 [IU] | ORAL_CAPSULE | ORAL | 0 refills | Status: DC
  Filled 2022-10-04: qty 8, 56d supply, fill #0

## 2022-10-04 MED ORDER — VITAMIN B-12 5000 MCG SL SUBL
5000.0000 ug | SUBLINGUAL_TABLET | Freq: Every day | SUBLINGUAL | 3 refills | Status: DC
  Filled 2022-10-04: qty 100, 100d supply, fill #0

## 2022-10-04 MED ORDER — VITAMIN D3 50 MCG (2000 UT) PO CAPS
2000.0000 [IU] | ORAL_CAPSULE | Freq: Every day | ORAL | 3 refills | Status: DC
  Filled 2022-10-04: qty 100, 100d supply, fill #0

## 2022-10-04 NOTE — Progress Notes (Addendum)
Pls cosign for B12 inj../lmb   Medical screening examination/treatment/procedure(s) were performed by non-physician practitioner and as supervising physician I was immediately available for consultation/collaboration.  I agree with above. Aleksei Plotnikov, MD  

## 2022-10-12 ENCOUNTER — Ambulatory Visit: Payer: Commercial Managed Care - PPO | Admitting: Internal Medicine

## 2022-11-08 ENCOUNTER — Encounter: Payer: Self-pay | Admitting: Internal Medicine

## 2022-11-08 ENCOUNTER — Other Ambulatory Visit (HOSPITAL_COMMUNITY): Payer: Self-pay

## 2022-11-08 ENCOUNTER — Ambulatory Visit: Payer: Commercial Managed Care - PPO | Admitting: Internal Medicine

## 2022-11-08 VITALS — BP 118/70 | HR 58 | Temp 98.0°F | Ht 66.0 in | Wt 182.0 lb

## 2022-11-08 DIAGNOSIS — I10 Essential (primary) hypertension: Secondary | ICD-10-CM | POA: Diagnosis not present

## 2022-11-08 DIAGNOSIS — G43709 Chronic migraine without aura, not intractable, without status migrainosus: Secondary | ICD-10-CM

## 2022-11-08 DIAGNOSIS — G43919 Migraine, unspecified, intractable, without status migrainosus: Secondary | ICD-10-CM

## 2022-11-08 DIAGNOSIS — E119 Type 2 diabetes mellitus without complications: Secondary | ICD-10-CM | POA: Diagnosis not present

## 2022-11-08 DIAGNOSIS — E538 Deficiency of other specified B group vitamins: Secondary | ICD-10-CM | POA: Diagnosis not present

## 2022-11-08 DIAGNOSIS — E559 Vitamin D deficiency, unspecified: Secondary | ICD-10-CM

## 2022-11-08 DIAGNOSIS — F419 Anxiety disorder, unspecified: Secondary | ICD-10-CM | POA: Diagnosis not present

## 2022-11-08 MED ORDER — LORAZEPAM 1 MG PO TABS
1.0000 mg | ORAL_TABLET | Freq: Two times a day (BID) | ORAL | 0 refills | Status: DC | PRN
  Filled 2022-11-08: qty 60, 30d supply, fill #0

## 2022-11-08 MED ORDER — CYANOCOBALAMIN 1000 MCG/ML IJ SOLN
1000.0000 ug | Freq: Once | INTRAMUSCULAR | Status: AC
Start: 2022-11-08 — End: 2022-11-08
  Administered 2022-11-08: 1000 ug via INTRAMUSCULAR

## 2022-11-08 NOTE — Progress Notes (Signed)
Subjective:  Patient ID: Dennis Simon, male    DOB: 1980-12-01  Age: 42 y.o. MRN: [id number]  CC: Follow-up (6 week f/u)   HPI Leonie Douglas presents for Vit D and B12 def, fatigue, HTN Less dizzy Oral surgery appt pending - c/o high anxiety  Outpatient Medications Prior to Visit  Medication Sig Dispense Refill   albuterol (VENTOLIN HFA) 108 (90 Base) MCG/ACT inhaler Inhale 2 puffs into the lungs every 4 (four) hours as needed for wheezing or shortness of breath. 6.7 g 3   Cholecalciferol (VITAMIN D3) 50 MCG (2000 UT) capsule Take 1 capsule (2,000 Units total) by mouth daily. 100 capsule 3   Cyanocobalamin (VITAMIN B-12) 5000 MCG SUBL Place 1 tablet (5,000 mcg total) under the tongue daily. 100 tablet 3   loratadine (CLARITIN) 10 MG tablet Take 1 tablet (10 mg total) by mouth daily. 100 tablet 3   valsartan (DIOVAN) 80 MG tablet Take 1 tablet (80 mg total) by mouth daily. 90 tablet 3   Vitamin D, Ergocalciferol, (DRISDOL) 1.25 MG (50000 UNIT) CAPS capsule Take 1 capsule (50,000 Units total) by mouth every 7 (seven) days. 8 capsule 0   rizatriptan (MAXALT-MLT) 10 MG disintegrating tablet Take 1 tablet (10 mg total) by mouth as needed. May repeat in 2 hours if needed (Patient not taking: Reported on 09/20/2022) 10 tablet 6   No facility-administered medications prior to visit.    ROS: Review of Systems  Constitutional:  Positive for fatigue. Negative for appetite change and unexpected weight change.  HENT:  Negative for congestion, nosebleeds, sneezing, sore throat and trouble swallowing.   Eyes:  Negative for itching and visual disturbance.  Respiratory:  Negative for cough.   Cardiovascular:  Negative for chest pain, palpitations and leg swelling.  Gastrointestinal:  Negative for abdominal distention, blood in stool, diarrhea and nausea.  Genitourinary:  Negative for frequency and hematuria.  Musculoskeletal:  Negative for back pain, gait problem, joint swelling and neck  pain.  Skin:  Negative for rash.  Neurological:  Negative for dizziness, tremors, speech difficulty and weakness.  Psychiatric/Behavioral:  Negative for agitation, dysphoric mood and sleep disturbance. The patient is not nervous/anxious.     Objective:  BP 118/70 (BP Location: Left Arm, Patient Position: Sitting, Cuff Size: Normal)   Pulse (!) 58   Temp 98 F (36.7 C) (Oral)   Ht 5\' 6"  (1.676 m)   Wt 182 lb (82.6 kg)   SpO2 98%   BMI 29.38 kg/m   BP Readings from Last 3 Encounters:  11/08/22 118/70  09/26/22 (!) 140/98  09/20/22 130/84    Wt Readings from Last 3 Encounters:  11/08/22 182 lb (82.6 kg)  09/26/22 177 lb (80.3 kg)  09/20/22 178 lb (80.7 kg)    Physical Exam Constitutional:      General: He is not in acute distress.    Appearance: Normal appearance. He is well-developed.     Comments: NAD  Eyes:     Conjunctiva/sclera: Conjunctivae normal.     Pupils: Pupils are equal, round, and reactive to light.  Neck:     Thyroid: No thyromegaly.     Vascular: No JVD.  Cardiovascular:     Rate and Rhythm: Normal rate and regular rhythm.     Heart sounds: Normal heart sounds. No murmur heard.    No friction rub. No gallop.  Pulmonary:     Effort: Pulmonary effort is normal. No respiratory distress.     Breath sounds: Normal breath  sounds. No wheezing or rales.  Chest:     Chest wall: No tenderness.  Abdominal:     General: Bowel sounds are normal. There is no distension.     Palpations: Abdomen is soft. There is no mass.     Tenderness: There is no abdominal tenderness. There is no guarding or rebound.  Musculoskeletal:        General: No tenderness. Normal range of motion.     Cervical back: Normal range of motion.  Lymphadenopathy:     Cervical: No cervical adenopathy.  Skin:    General: Skin is warm and dry.     Findings: No rash.  Neurological:     Mental Status: He is alert and oriented to person, place, and time.     Cranial Nerves: No cranial nerve  deficit.     Motor: No abnormal muscle tone.     Coordination: Coordination normal.     Gait: Gait normal.     Deep Tendon Reflexes: Reflexes are normal and symmetric.  Psychiatric:        Behavior: Behavior normal.        Thought Content: Thought content normal.        Judgment: Judgment normal.     Lab Results  Component Value Date   WBC 4.4 10/03/2022   HGB 14.6 10/03/2022   HCT 44.5 10/03/2022   PLT 268.0 10/03/2022   GLUCOSE 101 (H) 10/03/2022   CHOL 180 10/03/2022   TRIG 122.0 10/03/2022   HDL 36.50 (L) 10/03/2022   LDLCALC 119 (H) 10/03/2022   ALT 12 10/03/2022   AST 19 10/03/2022   NA 136 10/03/2022   K 4.6 10/03/2022   CL 99 10/03/2022   CREATININE 1.01 10/03/2022   BUN 10 10/03/2022   CO2 29 10/03/2022   TSH 1.85 10/03/2022   PSA 0.92 09/18/2022   HGBA1C 6.3 10/03/2022   MICROALBUR <0.7 10/03/2022    MR BRAIN/IAC W WO CONTRAST  Result Date: 09/14/2022 CLINICAL DATA:  Provided history: Chronic migraine without aura without status migrainosus, not intractable. Hearing loss, unspecified hearing loss type, unspecified laterality. Additional history provided by the scanning technologist: The patient reports slow onset of bilateral hearing loss, tinnitus. EXAM: MRI HEAD WITHOUT AND WITH CONTRAST TECHNIQUE: Multiplanar, multiecho pulse sequences of the brain and surrounding structures were obtained without and with intravenous contrast. CONTRAST:  7.44mL GADAVIST GADOBUTROL 1 MMOL/ML IV SOLN COMPARISON:  No pertinent prior exams available for comparison. FINDINGS: Brain: No age advanced or lobar predominant parenchymal atrophy. No cortical encephalomalacia is identified. No significant cerebral white matter disease. No evidence of an intracranial mass. Specifically, no cerebellopontine angle or internal auditory canal mass is demonstrated. Unremarkable appearance of the 7th and 8th cranial nerves bilaterally. There is no acute infarct. No chronic intracranial blood products.  No extra-axial fluid collection. No midline shift. No pathologic intracranial enhancement identified. Vascular: Maintained flow voids within the proximal large arterial vessels. Skull and upper cervical spine: No focal suspicious marrow lesion. Sinuses/Orbits: No mass or acute finding within the imaged orbits. No significant paranasal sinus disease. IMPRESSION: 1. Unremarkable MRI appearance of the brain. No evidence of an acute intracranial abnormality. 2. No cerebellopontine angle or internal auditory canal mass. 3. No specific cause of hearing loss or tinnitus is identified. Electronically Signed   By: Jackey Loge D.O.   On: 09/14/2022 13:26    Assessment & Plan:   Problem List Items Addressed This Visit     Migraine headache  Continue to use Maxalt as needed      Chronic migraine without aura without status migrainosus, not intractable    Better      Diabetes type 2, controlled (HCC)    On diet      HTN (hypertension) - Primary    On Diovan      Vitamin B12 deficiency    On B12 now Will give inj B12 today      Vitamin D deficiency    On Vit D      Anxiety    Situational Lorazepam prn - rare use      Relevant Medications   LORazepam (ATIVAN) 1 MG tablet      Meds ordered this encounter  Medications   LORazepam (ATIVAN) 1 MG tablet    Sig: Take 1 tablet (1 mg total) by mouth 2 (two) times daily as needed for anxiety.    Dispense:  60 tablet    Refill:  0      Follow-up: Return in about 3 months (around 02/08/2023) for a follow-up visit.  Sonda Primes, MD

## 2022-11-08 NOTE — Assessment & Plan Note (Signed)
  On diet  

## 2022-11-08 NOTE — Assessment & Plan Note (Signed)
Continue to use Maxalt as needed 

## 2022-11-08 NOTE — Assessment & Plan Note (Signed)
Situational Lorazepam prn - rare use

## 2022-11-08 NOTE — Assessment & Plan Note (Signed)
On Diovan °

## 2022-11-08 NOTE — Assessment & Plan Note (Signed)
On B12 now Will give inj B12 today

## 2022-11-08 NOTE — Assessment & Plan Note (Signed)
Better  

## 2022-11-08 NOTE — Assessment & Plan Note (Signed)
On Vit D 

## 2022-11-20 ENCOUNTER — Other Ambulatory Visit (HOSPITAL_COMMUNITY): Payer: Self-pay

## 2022-11-30 ENCOUNTER — Encounter (INDEPENDENT_AMBULATORY_CARE_PROVIDER_SITE_OTHER): Payer: Self-pay

## 2022-12-21 ENCOUNTER — Ambulatory Visit: Payer: Commercial Managed Care - PPO | Admitting: Internal Medicine

## 2023-01-08 DIAGNOSIS — E119 Type 2 diabetes mellitus without complications: Secondary | ICD-10-CM | POA: Diagnosis not present

## 2023-01-08 DIAGNOSIS — I10 Essential (primary) hypertension: Secondary | ICD-10-CM | POA: Diagnosis not present

## 2023-01-08 DIAGNOSIS — E291 Testicular hypofunction: Secondary | ICD-10-CM | POA: Diagnosis not present

## 2023-01-08 DIAGNOSIS — Z23 Encounter for immunization: Secondary | ICD-10-CM | POA: Diagnosis not present

## 2023-01-08 DIAGNOSIS — Z125 Encounter for screening for malignant neoplasm of prostate: Secondary | ICD-10-CM | POA: Diagnosis not present

## 2023-01-08 DIAGNOSIS — F411 Generalized anxiety disorder: Secondary | ICD-10-CM | POA: Diagnosis not present

## 2023-01-08 DIAGNOSIS — E538 Deficiency of other specified B group vitamins: Secondary | ICD-10-CM | POA: Diagnosis not present

## 2023-01-08 DIAGNOSIS — E559 Vitamin D deficiency, unspecified: Secondary | ICD-10-CM | POA: Diagnosis not present

## 2023-01-29 DIAGNOSIS — I10 Essential (primary) hypertension: Secondary | ICD-10-CM | POA: Diagnosis not present

## 2023-02-08 ENCOUNTER — Ambulatory Visit: Payer: Commercial Managed Care - PPO | Admitting: Internal Medicine

## 2023-03-08 DIAGNOSIS — E119 Type 2 diabetes mellitus without complications: Secondary | ICD-10-CM | POA: Diagnosis not present

## 2023-03-08 DIAGNOSIS — E785 Hyperlipidemia, unspecified: Secondary | ICD-10-CM | POA: Diagnosis not present

## 2023-03-08 DIAGNOSIS — I10 Essential (primary) hypertension: Secondary | ICD-10-CM | POA: Diagnosis not present

## 2023-03-22 ENCOUNTER — Encounter (HOSPITAL_COMMUNITY): Payer: Self-pay

## 2023-03-22 ENCOUNTER — Other Ambulatory Visit (HOSPITAL_COMMUNITY): Payer: Self-pay

## 2023-03-22 DIAGNOSIS — E291 Testicular hypofunction: Secondary | ICD-10-CM | POA: Diagnosis not present

## 2023-03-22 MED ORDER — "BD LUER-LOK SYRINGE 18G X 1-1/2"" 3 ML MISC"
3 refills | Status: DC
  Filled 2023-05-21: qty 12, 12d supply, fill #0
  Filled 2023-05-21: qty 12, 84d supply, fill #0

## 2023-03-22 MED ORDER — NEEDLE (DISP) 23G X 1" MISC
1 refills | Status: DC
Start: 2023-03-22 — End: 2023-08-21
  Filled 2023-05-21: qty 12, 84d supply, fill #0

## 2023-03-22 MED ORDER — TESTOSTERONE CYPIONATE 200 MG/ML IM SOLN
100.0000 mg | INTRAMUSCULAR | 0 refills | Status: DC
Start: 2023-03-22 — End: 2023-08-21
  Filled 2023-03-22 – 2023-03-23 (×2): qty 2, 28d supply, fill #0
  Filled 2023-05-20: qty 6, 84d supply, fill #0

## 2023-03-23 ENCOUNTER — Other Ambulatory Visit (HOSPITAL_COMMUNITY): Payer: Self-pay

## 2023-04-06 ENCOUNTER — Other Ambulatory Visit (HOSPITAL_COMMUNITY): Payer: Self-pay

## 2023-04-06 MED ORDER — VALSARTAN 80 MG PO TABS
120.0000 mg | ORAL_TABLET | Freq: Every day | ORAL | 0 refills | Status: DC
Start: 2023-04-06 — End: 2023-07-02
  Filled 2023-04-06: qty 135, 90d supply, fill #0

## 2023-04-06 MED ORDER — JARDIANCE 10 MG PO TABS
10.0000 mg | ORAL_TABLET | Freq: Every day | ORAL | 1 refills | Status: DC
  Filled 2023-04-06: qty 90, 90d supply, fill #0

## 2023-04-20 ENCOUNTER — Other Ambulatory Visit (HOSPITAL_COMMUNITY): Payer: Self-pay

## 2023-04-20 DIAGNOSIS — E119 Type 2 diabetes mellitus without complications: Secondary | ICD-10-CM | POA: Diagnosis not present

## 2023-04-20 DIAGNOSIS — E291 Testicular hypofunction: Secondary | ICD-10-CM | POA: Diagnosis not present

## 2023-04-20 DIAGNOSIS — K219 Gastro-esophageal reflux disease without esophagitis: Secondary | ICD-10-CM | POA: Diagnosis not present

## 2023-04-20 DIAGNOSIS — I10 Essential (primary) hypertension: Secondary | ICD-10-CM | POA: Diagnosis not present

## 2023-04-20 MED ORDER — OMEPRAZOLE 20 MG PO CPDR
20.0000 mg | DELAYED_RELEASE_CAPSULE | Freq: Every day | ORAL | 1 refills | Status: DC
  Filled 2023-04-20 – 2023-05-03 (×2): qty 90, 90d supply, fill #0

## 2023-04-30 ENCOUNTER — Other Ambulatory Visit (HOSPITAL_COMMUNITY): Payer: Self-pay

## 2023-05-03 ENCOUNTER — Other Ambulatory Visit (HOSPITAL_COMMUNITY): Payer: Self-pay

## 2023-05-21 ENCOUNTER — Other Ambulatory Visit (HOSPITAL_COMMUNITY): Payer: Self-pay

## 2023-05-21 ENCOUNTER — Other Ambulatory Visit: Payer: Self-pay

## 2023-05-30 ENCOUNTER — Other Ambulatory Visit (HOSPITAL_COMMUNITY): Payer: Self-pay

## 2023-05-30 DIAGNOSIS — I10 Essential (primary) hypertension: Secondary | ICD-10-CM | POA: Diagnosis not present

## 2023-05-30 DIAGNOSIS — R112 Nausea with vomiting, unspecified: Secondary | ICD-10-CM | POA: Diagnosis not present

## 2023-05-30 DIAGNOSIS — E119 Type 2 diabetes mellitus without complications: Secondary | ICD-10-CM | POA: Diagnosis not present

## 2023-05-30 MED ORDER — ONDANSETRON HCL 4 MG PO TABS
4.0000 mg | ORAL_TABLET | Freq: Every day | ORAL | 1 refills | Status: DC
  Filled 2023-05-30: qty 10, 10d supply, fill #0

## 2023-05-31 ENCOUNTER — Other Ambulatory Visit (HOSPITAL_COMMUNITY): Payer: Self-pay

## 2023-06-13 ENCOUNTER — Other Ambulatory Visit (HOSPITAL_COMMUNITY): Payer: Self-pay

## 2023-06-13 DIAGNOSIS — K5900 Constipation, unspecified: Secondary | ICD-10-CM | POA: Diagnosis not present

## 2023-06-13 DIAGNOSIS — R112 Nausea with vomiting, unspecified: Secondary | ICD-10-CM | POA: Diagnosis not present

## 2023-06-13 DIAGNOSIS — K219 Gastro-esophageal reflux disease without esophagitis: Secondary | ICD-10-CM | POA: Diagnosis not present

## 2023-06-13 MED ORDER — OMEPRAZOLE 40 MG PO CPDR
40.0000 mg | DELAYED_RELEASE_CAPSULE | Freq: Two times a day (BID) | ORAL | 0 refills | Status: DC
  Filled 2023-06-13: qty 60, 30d supply, fill #0

## 2023-06-14 ENCOUNTER — Other Ambulatory Visit (HOSPITAL_COMMUNITY): Payer: Self-pay

## 2023-06-29 DIAGNOSIS — K209 Esophagitis, unspecified without bleeding: Secondary | ICD-10-CM | POA: Diagnosis not present

## 2023-06-29 DIAGNOSIS — K219 Gastro-esophageal reflux disease without esophagitis: Secondary | ICD-10-CM | POA: Diagnosis not present

## 2023-06-29 DIAGNOSIS — B3781 Candidal esophagitis: Secondary | ICD-10-CM | POA: Diagnosis not present

## 2023-06-29 DIAGNOSIS — R6881 Early satiety: Secondary | ICD-10-CM | POA: Diagnosis not present

## 2023-06-29 DIAGNOSIS — R112 Nausea with vomiting, unspecified: Secondary | ICD-10-CM | POA: Diagnosis not present

## 2023-06-29 DIAGNOSIS — K2930 Chronic superficial gastritis without bleeding: Secondary | ICD-10-CM | POA: Diagnosis not present

## 2023-06-29 DIAGNOSIS — R1310 Dysphagia, unspecified: Secondary | ICD-10-CM | POA: Diagnosis not present

## 2023-06-29 DIAGNOSIS — K900 Celiac disease: Secondary | ICD-10-CM | POA: Diagnosis not present

## 2023-06-30 ENCOUNTER — Other Ambulatory Visit (HOSPITAL_COMMUNITY): Payer: Self-pay

## 2023-07-02 ENCOUNTER — Other Ambulatory Visit (HOSPITAL_COMMUNITY): Payer: Self-pay

## 2023-07-02 MED ORDER — VALSARTAN 80 MG PO TABS
120.0000 mg | ORAL_TABLET | Freq: Every day | ORAL | 2 refills | Status: DC
  Filled 2023-07-02: qty 135, 90d supply, fill #0

## 2023-07-03 ENCOUNTER — Other Ambulatory Visit (HOSPITAL_COMMUNITY): Payer: Self-pay

## 2023-07-09 DIAGNOSIS — E785 Hyperlipidemia, unspecified: Secondary | ICD-10-CM | POA: Diagnosis not present

## 2023-07-09 DIAGNOSIS — E119 Type 2 diabetes mellitus without complications: Secondary | ICD-10-CM | POA: Diagnosis not present

## 2023-07-09 DIAGNOSIS — K219 Gastro-esophageal reflux disease without esophagitis: Secondary | ICD-10-CM | POA: Diagnosis not present

## 2023-07-09 DIAGNOSIS — I10 Essential (primary) hypertension: Secondary | ICD-10-CM | POA: Diagnosis not present

## 2023-07-16 ENCOUNTER — Other Ambulatory Visit (HOSPITAL_COMMUNITY): Payer: Self-pay

## 2023-07-16 MED ORDER — FLUCONAZOLE 100 MG PO TABS
ORAL_TABLET | ORAL | 0 refills | Status: AC
  Filled 2023-07-16: qty 11, 10d supply, fill #0

## 2023-07-17 DIAGNOSIS — K900 Celiac disease: Secondary | ICD-10-CM | POA: Diagnosis not present

## 2023-07-19 ENCOUNTER — Ambulatory Visit (HOSPITAL_COMMUNITY)
Admission: RE | Admit: 2023-07-19 | Discharge: 2023-07-19 | Disposition: A | Discharge status: Home / Self Care | Source: Ambulatory Visit | Attending: Family Medicine | Admitting: Family Medicine

## 2023-07-19 ENCOUNTER — Other Ambulatory Visit: Payer: Self-pay | Admitting: Family Medicine

## 2023-07-19 ENCOUNTER — Other Ambulatory Visit (HOSPITAL_COMMUNITY): Payer: Self-pay | Admitting: Family Medicine

## 2023-07-19 DIAGNOSIS — R7989 Other specified abnormal findings of blood chemistry: Secondary | ICD-10-CM | POA: Diagnosis not present

## 2023-07-19 DIAGNOSIS — R945 Abnormal results of liver function studies: Secondary | ICD-10-CM | POA: Diagnosis not present

## 2023-07-19 MED ORDER — IOHEXOL 300 MG/ML  SOLN
100.0000 mL | Freq: Once | INTRAMUSCULAR | Status: AC | PRN
  Administered 2023-07-19: 100 mL via INTRAVENOUS

## 2023-07-19 MED ORDER — SODIUM CHLORIDE (PF) 0.9 % IJ SOLN
INTRAMUSCULAR | Status: AC
  Filled 2023-07-19: qty 50

## 2023-07-19 MED ORDER — IOHEXOL 9 MG/ML PO SOLN
ORAL | Status: AC
  Filled 2023-07-19: qty 1000

## 2023-07-19 MED ORDER — IOHEXOL 9 MG/ML PO SOLN
1000.0000 mL | ORAL | Status: AC
  Administered 2023-07-19: 1000 mL via ORAL

## 2023-07-23 DIAGNOSIS — R945 Abnormal results of liver function studies: Secondary | ICD-10-CM | POA: Diagnosis not present

## 2023-07-23 DIAGNOSIS — E785 Hyperlipidemia, unspecified: Secondary | ICD-10-CM | POA: Diagnosis not present

## 2023-07-23 DIAGNOSIS — R7989 Other specified abnormal findings of blood chemistry: Secondary | ICD-10-CM | POA: Diagnosis not present

## 2023-07-24 DIAGNOSIS — E119 Type 2 diabetes mellitus without complications: Secondary | ICD-10-CM | POA: Diagnosis not present

## 2023-07-24 DIAGNOSIS — I10 Essential (primary) hypertension: Secondary | ICD-10-CM | POA: Diagnosis not present

## 2023-07-24 DIAGNOSIS — R945 Abnormal results of liver function studies: Secondary | ICD-10-CM | POA: Diagnosis not present

## 2023-07-24 DIAGNOSIS — K759 Inflammatory liver disease, unspecified: Secondary | ICD-10-CM | POA: Diagnosis not present

## 2023-07-24 DIAGNOSIS — R5383 Other fatigue: Secondary | ICD-10-CM | POA: Diagnosis not present

## 2023-07-26 DIAGNOSIS — R7989 Other specified abnormal findings of blood chemistry: Secondary | ICD-10-CM | POA: Diagnosis not present

## 2023-07-26 DIAGNOSIS — R945 Abnormal results of liver function studies: Secondary | ICD-10-CM | POA: Diagnosis not present

## 2023-07-30 DIAGNOSIS — R7989 Other specified abnormal findings of blood chemistry: Secondary | ICD-10-CM | POA: Diagnosis not present

## 2023-07-30 DIAGNOSIS — B3781 Candidal esophagitis: Secondary | ICD-10-CM | POA: Diagnosis not present

## 2023-07-30 DIAGNOSIS — R945 Abnormal results of liver function studies: Secondary | ICD-10-CM | POA: Diagnosis not present

## 2023-07-30 DIAGNOSIS — K900 Celiac disease: Secondary | ICD-10-CM | POA: Diagnosis not present

## 2023-08-13 DIAGNOSIS — R748 Abnormal levels of other serum enzymes: Secondary | ICD-10-CM | POA: Diagnosis not present

## 2023-08-13 DIAGNOSIS — R945 Abnormal results of liver function studies: Secondary | ICD-10-CM | POA: Diagnosis not present

## 2023-08-16 DIAGNOSIS — K900 Celiac disease: Secondary | ICD-10-CM | POA: Diagnosis not present

## 2023-08-16 DIAGNOSIS — R7989 Other specified abnormal findings of blood chemistry: Secondary | ICD-10-CM | POA: Diagnosis not present

## 2023-08-17 ENCOUNTER — Other Ambulatory Visit (HOSPITAL_COMMUNITY): Payer: Self-pay

## 2023-08-17 MED ORDER — SUCRALFATE 1 G PO TABS
1.0000 g | ORAL_TABLET | Freq: Two times a day (BID) | ORAL | 2 refills | Status: AC
  Filled 2023-08-17: qty 60, 30d supply, fill #0

## 2023-08-20 ENCOUNTER — Other Ambulatory Visit: Payer: Self-pay

## 2023-08-20 ENCOUNTER — Emergency Department (HOSPITAL_COMMUNITY)
Admission: EM | Admit: 2023-08-20 | Discharge: 2023-08-20 | Disposition: A | Discharge status: Home / Self Care | Attending: Emergency Medicine | Admitting: Emergency Medicine

## 2023-08-20 ENCOUNTER — Encounter (HOSPITAL_COMMUNITY): Payer: Self-pay | Admitting: Emergency Medicine

## 2023-08-20 DIAGNOSIS — R109 Unspecified abdominal pain: Secondary | ICD-10-CM | POA: Insufficient documentation

## 2023-08-20 DIAGNOSIS — E119 Type 2 diabetes mellitus without complications: Secondary | ICD-10-CM | POA: Diagnosis not present

## 2023-08-20 DIAGNOSIS — Z79899 Other long term (current) drug therapy: Secondary | ICD-10-CM | POA: Diagnosis not present

## 2023-08-20 DIAGNOSIS — R5383 Other fatigue: Secondary | ICD-10-CM | POA: Insufficient documentation

## 2023-08-20 DIAGNOSIS — R42 Dizziness and giddiness: Secondary | ICD-10-CM | POA: Insufficient documentation

## 2023-08-20 DIAGNOSIS — R001 Bradycardia, unspecified: Secondary | ICD-10-CM | POA: Insufficient documentation

## 2023-08-20 LAB — COMPREHENSIVE METABOLIC PANEL WITH GFR
ALT: 680 U/L — ABNORMAL HIGH (ref 0–44)
AST: 635 U/L — ABNORMAL HIGH (ref 15–41)
Albumin: 4 g/dL (ref 3.5–5.0)
Alkaline Phosphatase: 148 U/L — ABNORMAL HIGH (ref 38–126)
Anion gap: 9 (ref 5–15)
BUN: 10 mg/dL (ref 6–20)
CO2: 26 mmol/L (ref 22–32)
Calcium: 9.4 mg/dL (ref 8.9–10.3)
Chloride: 103 mmol/L (ref 98–111)
Creatinine, Ser: 1.08 mg/dL (ref 0.61–1.24)
GFR, Estimated: >60 mL/min (ref >60)
Glucose, Bld: 111 mg/dL — ABNORMAL HIGH (ref 70–99)
Potassium: 4.2 mmol/L (ref 3.5–5.1)
Sodium: 138 mmol/L (ref 135–145)
Total Bilirubin: 1.7 mg/dL — ABNORMAL HIGH (ref 0.0–1.2)
Total Protein: 7 g/dL (ref 6.5–8.1)

## 2023-08-20 LAB — CBC
HCT: 44.6 % (ref 39.0–52.0)
Hemoglobin: 14.4 g/dL (ref 13.0–17.0)
MCH: 28.8 pg (ref 26.0–34.0)
MCHC: 32.3 g/dL (ref 30.0–36.0)
MCV: 89.2 fL (ref 80.0–100.0)
Platelets: 200 10*3/uL (ref 150–400)
RBC: 5 MIL/uL (ref 4.22–5.81)
RDW: 13.4 % (ref 11.5–15.5)
WBC: 3.9 10*3/uL — ABNORMAL LOW (ref 4.0–10.5)
nRBC: 0 % (ref 0.0–0.2)

## 2023-08-20 MED ORDER — LACTATED RINGERS IV BOLUS
1000.0000 mL | Freq: Once | INTRAVENOUS | Status: AC
  Administered 2023-08-20: 1000 mL via INTRAVENOUS

## 2023-08-20 MED ORDER — FENTANYL CITRATE (PF) 100 MCG/2ML IJ SOLN
INTRAMUSCULAR | Status: AC
  Filled 2023-08-20: qty 2

## 2023-08-20 MED ORDER — MIDAZOLAM HCL 2 MG/2ML IJ SOLN
INTRAMUSCULAR | Status: AC
  Filled 2023-08-20: qty 4

## 2023-08-20 NOTE — ED Triage Notes (Signed)
 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

## 2023-08-20 NOTE — Discharge Instructions (Signed)
 You were seen for your lightheadedness and the heart rate in the emergency department.   At home, please stay well-hydrated.    Follow-up with your primary doctor in 2-3 days regarding your visit.  Cardiology will be calling you regarding an appointment within the next 72 hours.  You may contact them if you do not hear from them in that time using the information in this packet.  Please talk to them to see if you need an outpatient (Holter) monitor.  Return immediately to the emergency department if you experience any of the following: Chest pain, shortness of breath, fainting, or any other concerning symptoms.    Thank you for visiting our Emergency Department. It was a pleasure taking care of you today.

## 2023-08-20 NOTE — ED Provider Notes (Signed)
 [place] EMERGENCY DEPARTMENT AT [facility] Provider Note   CSN: [id number] Arrival date & time: 08/20/23  1040     History {Add pertinent medical, surgical, social history, OB history to HPI:1} Chief Complaint  Patient presents with   Abdominal Pain   Dizziness   Fatigue    Dennis Simon is a 43 y.o. male.  43 year old male with a history of diabetes, GERD, celiac disease, and elevated LFTs of unclear etiology who presents emergency department with lightheadedness.  Patient reports that he has been feeling lightheaded and dizzy for several days.  No bleeding or alleviating factors.  Last Thursday noticed that his heart rate was down in the 30s when he was feeling lightheaded.  Has not lost consciousness.  No palpitations.  Undergoing a workup for his elevated LFTs.  Supposed have a liver biopsy tomorrow at [facility].  No cardiac history per the patient.       Home Medications Prior to Admission medications   Medication Sig Start Date End Date Taking? Authorizing Provider  albuterol  (VENTOLIN  HFA) 108 (90 Base) MCG/ACT inhaler Inhale 2 puffs into the lungs every 4 (four) hours as needed for wheezing or shortness of breath. 09/20/22   Plotnikov, Oakley Bellman, MD  Cholecalciferol  (VITAMIN D3) 50 MCG (2000 UT) capsule Take 1 capsule (2,000 Units total) by mouth daily. 10/04/22   Plotnikov, Aleksei V, MD  Cyanocobalamin  (VITAMIN B-12) 5000 MCG SUBL Place 1 tablet (5,000 mcg total) under the tongue daily. 10/04/22   Plotnikov, Oakley Bellman, MD  empagliflozin  (JARDIANCE ) 10 MG TABS tablet Take 1 tablet (10 mg total) by mouth daily. 04/06/23     loratadine  (CLARITIN ) 10 MG tablet Take 1 tablet (10 mg total) by mouth daily. 09/20/22   Plotnikov, Aleksei V, MD  LORazepam  (ATIVAN ) 1 MG tablet Take 1 tablet (1 mg total) by mouth 2 (two) times daily as needed for anxiety. 11/08/22   Plotnikov, Aleksei V, MD  SYRINGE-NEEDLE, DISP, 3 ML (B-D 3CC LUER-LOK SYR 18GX1-1/2) 18G X 1-1/2" 3 ML MISC Use  once a week to draw up medication. 03/22/23     NEEDLE, DISP, 23 G 23G X 1" MISC Use to inject testosterone  into the muscle once a week. 03/22/23     omeprazole  (PRILOSEC) 20 MG capsule Take 1 capsule (20 mg total) by mouth once daily, 1/2 to 1 hour before morning meal. 04/20/23     omeprazole  (PRILOSEC) 40 MG capsule Take 1 capsule (40 mg total) by mouth 2 (two) times daily. 06/13/23 07/14/23    ondansetron  (ZOFRAN ) 4 MG tablet Take 1 tablet (4 mg total) by mouth daily as needed for nausea. 05/30/23     rizatriptan  (MAXALT -MLT) 10 MG disintegrating tablet Take 1 tablet (10 mg total) by mouth as needed. May repeat in 2 hours if needed Patient not taking: Reported on 09/20/2022 09/08/21   Phebe Brasil, MD  sucralfate  (CARAFATE ) 1 g tablet Take 1 tablet (1 g total) by mouth 2 (two) times daily on an empty stomach 08/17/23     testosterone  cypionate (DEPOTESTOSTERONE CYPIONATE) 200 MG/ML injection Inject 0.5 mLs (100 mg total) into the muscle once a week. 03/22/23     valsartan  (DIOVAN ) 80 MG tablet Take 1 tablet (80 mg total) by mouth daily. 09/26/22   Plotnikov, Oakley Bellman, MD  valsartan  (DIOVAN ) 80 MG tablet Take 1.5 tablets (120 mg total) by mouth daily. 07/02/23     Vitamin D , Ergocalciferol , (DRISDOL ) 1.25 MG (50000 UNIT) CAPS capsule Take 1 capsule (50,000 Units  total) by mouth every 7 (seven) days. 10/04/22   Plotnikov, Oakley Bellman, MD      Allergies    Hydrocodone    Review of Systems   Review of Systems  Physical Exam Updated Vital Signs BP (!) 174/61 (BP Location: Left Arm)   Pulse (!) 40   Temp 98.3 F (36.8 C) (Oral)   Resp 15   SpO2 100%  Physical Exam Vitals and nursing note reviewed.  Constitutional:      General: He is not in acute distress.    Appearance: He is well-developed.  HENT:     Head: Normocephalic and atraumatic.     Right Ear: External ear normal.     Left Ear: External ear normal.     Nose: Nose normal.  Eyes:     Extraocular Movements: Extraocular movements intact.      Conjunctiva/sclera: Conjunctivae normal.     Pupils: Pupils are equal, round, and reactive to light.  Cardiovascular:     Rate and Rhythm: Normal rate and regular rhythm.     Heart sounds: Normal heart sounds.  Pulmonary:     Effort: Pulmonary effort is normal. No respiratory distress.     Breath sounds: Normal breath sounds.  Musculoskeletal:     Cervical back: Normal range of motion and neck supple.     Right lower leg: No edema.     Left lower leg: No edema.  Skin:    General: Skin is warm and dry.  Neurological:     Mental Status: He is alert. Mental status is at baseline.  Psychiatric:        Mood and Affect: Mood normal.        Behavior: Behavior normal.     ED Results / Procedures / Treatments   Labs (all labs ordered are listed, but only abnormal results are displayed) Labs Reviewed  CBC  COMPREHENSIVE METABOLIC PANEL WITH GFR    EKG EKG Interpretation Date/Time:  Monday Aug 20 2023 10:51:50 EDT Ventricular Rate:  63 PR Interval:  130 QRS Duration:  76 QT Interval:  406 QTC Calculation: 415 R Axis:   63  Text Interpretation: Normal sinus rhythm with sinus arrhythmia Normal ECG When compared with ECG of 29-Aug-2022 15:43, PREVIOUS ECG IS PRESENT Confirmed by Shyrl Doyne [phone]) on 08/20/2023 11:27:33 AM  Radiology No results found.  Procedures Procedures  {Document cardiac monitor, telemetry assessment procedure when appropriate:1}  Medications Ordered in ED Medications - No data to display  ED Course/ Medical Decision Making/ A&P   {   Click here for ABCD2, HEART and other calculatorsREFRESH Note before signing :1}                              Medical Decision Making Amount and/or Complexity of Data Reviewed Labs: ordered.   ***  {Document critical care time when appropriate:1} {Document review of labs and clinical decision tools ie heart score, Chads2Vasc2 etc:1}  {Document your independent review of radiology images, and any outside  records:1} {Document your discussion with family members, caretakers, and with consultants:1} {Document social determinants of health affecting pt's care:1} {Document your decision making why or why not admission, treatments were needed:1} Final Clinical Impression(s) / ED Diagnoses Final diagnoses:  None    Rx / DC Orders ED Discharge Orders     None

## 2023-08-21 ENCOUNTER — Encounter (HOSPITAL_COMMUNITY): Payer: Self-pay

## 2023-08-21 ENCOUNTER — Other Ambulatory Visit: Payer: Self-pay

## 2023-08-21 ENCOUNTER — Observation Stay (HOSPITAL_COMMUNITY)
Admission: EM | Admit: 2023-08-21 | Discharge: 2023-08-23 | Disposition: A | Discharge status: Home / Self Care | Attending: Internal Medicine | Admitting: Internal Medicine

## 2023-08-21 DIAGNOSIS — E119 Type 2 diabetes mellitus without complications: Secondary | ICD-10-CM | POA: Diagnosis not present

## 2023-08-21 DIAGNOSIS — F419 Anxiety disorder, unspecified: Secondary | ICD-10-CM | POA: Diagnosis present

## 2023-08-21 DIAGNOSIS — J45909 Unspecified asthma, uncomplicated: Secondary | ICD-10-CM | POA: Insufficient documentation

## 2023-08-21 DIAGNOSIS — R7401 Elevation of levels of liver transaminase levels: Secondary | ICD-10-CM | POA: Diagnosis not present

## 2023-08-21 DIAGNOSIS — R001 Bradycardia, unspecified: Secondary | ICD-10-CM

## 2023-08-21 DIAGNOSIS — R55 Syncope and collapse: Secondary | ICD-10-CM | POA: Diagnosis not present

## 2023-08-21 DIAGNOSIS — I10 Essential (primary) hypertension: Secondary | ICD-10-CM | POA: Diagnosis not present

## 2023-08-21 DIAGNOSIS — Z79899 Other long term (current) drug therapy: Secondary | ICD-10-CM | POA: Insufficient documentation

## 2023-08-21 DIAGNOSIS — R0602 Shortness of breath: Secondary | ICD-10-CM | POA: Insufficient documentation

## 2023-08-21 DIAGNOSIS — R7989 Other specified abnormal findings of blood chemistry: Secondary | ICD-10-CM | POA: Diagnosis not present

## 2023-08-21 LAB — TSH: TSH: 1.827 u[IU]/mL (ref 0.350–4.500)

## 2023-08-21 LAB — HEPATIC FUNCTION PANEL
ALT: 646 U/L — ABNORMAL HIGH (ref 0–44)
AST: 578 U/L — ABNORMAL HIGH (ref 15–41)
Albumin: 4.1 g/dL (ref 3.5–5.0)
Alkaline Phosphatase: 149 U/L — ABNORMAL HIGH (ref 38–126)
Bilirubin, Direct: 0.4 mg/dL — ABNORMAL HIGH (ref 0.0–0.2)
Indirect Bilirubin: 0.8 mg/dL (ref 0.3–0.9)
Total Bilirubin: 1.2 mg/dL (ref 0.0–1.2)
Total Protein: 7.3 g/dL (ref 6.5–8.1)

## 2023-08-21 LAB — URINALYSIS, W/ REFLEX TO CULTURE (INFECTION SUSPECTED)
Bacteria, UA: NONE SEEN
Bilirubin Urine: NEGATIVE
Glucose, UA: NEGATIVE mg/dL
Hgb urine dipstick: NEGATIVE
Ketones, ur: NEGATIVE mg/dL
Leukocytes,Ua: NEGATIVE
Nitrite: NEGATIVE
Protein, ur: NEGATIVE mg/dL
RBC / HPF: NONE SEEN RBC/hpf (ref 0–5)
Specific Gravity, Urine: 1.01 (ref 1.005–1.030)
WBC, UA: NONE SEEN WBC/hpf (ref 0–5)
pH: 7 (ref 5.0–8.0)

## 2023-08-21 LAB — CBC
HCT: 44.3 % (ref 39.0–52.0)
Hemoglobin: 14.8 g/dL (ref 13.0–17.0)
MCH: 29.8 pg (ref 26.0–34.0)
MCHC: 33.4 g/dL (ref 30.0–36.0)
MCV: 89.3 fL (ref 80.0–100.0)
Platelets: 182 10*3/uL (ref 150–400)
RBC: 4.96 MIL/uL (ref 4.22–5.81)
RDW: 13.6 % (ref 11.5–15.5)
WBC: 3.5 10*3/uL — ABNORMAL LOW (ref 4.0–10.5)
nRBC: 0 % (ref 0.0–0.2)

## 2023-08-21 LAB — BASIC METABOLIC PANEL WITH GFR
Anion gap: 13 (ref 5–15)
BUN: 7 mg/dL (ref 6–20)
CO2: 24 mmol/L (ref 22–32)
Calcium: 9.3 mg/dL (ref 8.9–10.3)
Chloride: 100 mmol/L (ref 98–111)
Creatinine, Ser: 1.13 mg/dL (ref 0.61–1.24)
GFR, Estimated: >60 mL/min (ref >60)
Glucose, Bld: 128 mg/dL — ABNORMAL HIGH (ref 70–99)
Potassium: 3.8 mmol/L (ref 3.5–5.1)
Sodium: 137 mmol/L (ref 135–145)

## 2023-08-21 LAB — TROPONIN I (HIGH SENSITIVITY): Troponin I (High Sensitivity): 3 ng/L (ref <18)

## 2023-08-21 LAB — MAGNESIUM: Magnesium: 2 mg/dL (ref 1.7–2.4)

## 2023-08-21 LAB — LIPASE, BLOOD: Lipase: 40 U/L (ref 11–51)

## 2023-08-21 LAB — PHOSPHORUS: Phosphorus: 4.3 mg/dL (ref 2.5–4.6)

## 2023-08-21 LAB — BRAIN NATRIURETIC PEPTIDE: B Natriuretic Peptide: 53.5 pg/mL (ref 0.0–100.0)

## 2023-08-21 LAB — CBG MONITORING, ED: Glucose-Capillary: 116 mg/dL — ABNORMAL HIGH (ref 70–99)

## 2023-08-21 MED ORDER — SENNOSIDES-DOCUSATE SODIUM 8.6-50 MG PO TABS: 1.0000 | ORAL_TABLET | Freq: Every evening | ORAL | Status: DC | PRN

## 2023-08-21 MED ORDER — ACETAMINOPHEN 325 MG PO TABS: 650.0000 mg | ORAL_TABLET | Freq: Four times a day (QID) | ORAL | Status: DC | PRN

## 2023-08-21 MED ORDER — ONDANSETRON HCL 4 MG/2ML IJ SOLN: 4.0000 mg | Freq: Four times a day (QID) | INTRAMUSCULAR | Status: DC | PRN

## 2023-08-21 MED ORDER — ALBUTEROL SULFATE (2.5 MG/3ML) 0.083% IN NEBU: 2.5000 mg | INHALATION_SOLUTION | Freq: Four times a day (QID) | RESPIRATORY_TRACT | Status: DC | PRN

## 2023-08-21 MED ORDER — ACETAMINOPHEN 650 MG RE SUPP
650.0000 mg | Freq: Four times a day (QID) | RECTAL | Status: DC | PRN
Start: 2023-08-21 — End: 2023-08-22

## 2023-08-21 MED ORDER — IRBESARTAN 150 MG PO TABS
75.0000 mg | ORAL_TABLET | Freq: Every day | ORAL | Status: DC
  Filled 2023-08-21 (×2): qty 1

## 2023-08-21 MED ORDER — ENOXAPARIN SODIUM 40 MG/0.4ML IJ SOSY
40.0000 mg | PREFILLED_SYRINGE | INTRAMUSCULAR | Status: DC
  Filled 2023-08-21: qty 0.4

## 2023-08-21 MED ORDER — INSULIN ASPART 100 UNIT/ML IJ SOLN: 0.0000 [IU] | Freq: Every day | INTRAMUSCULAR | Status: DC

## 2023-08-21 MED ORDER — INSULIN ASPART 100 UNIT/ML IJ SOLN: 0.0000 [IU] | Freq: Three times a day (TID) | INTRAMUSCULAR | Status: DC

## 2023-08-21 MED ORDER — SODIUM CHLORIDE 0.9% FLUSH
3.0000 mL | Freq: Two times a day (BID) | INTRAVENOUS | Status: DC
  Administered 2023-08-22 – 2023-08-23 (×3): 3 mL via INTRAVENOUS

## 2023-08-21 MED ORDER — ONDANSETRON HCL 4 MG PO TABS: 4.0000 mg | ORAL_TABLET | Freq: Four times a day (QID) | ORAL | Status: DC | PRN

## 2023-08-21 NOTE — H&P (Signed)
 History and Physical    KEN CAMBRAY WUJ:[id number] DOB: 26-Jan-1981 DOA: 08/21/2023  PCP: Genia Kettering, MD   Patient coming from: Home   Chief Complaint: Lightheaded, near-syncope, slow HR  HPI: MADS LORENC is a 43 y.o. male with medical history significant for hypertension, type 2 diabetes mellitus, migraines, anxiety, and elevated LFTs currently being worked up through [facility] who presents with complaints of low heart rate and episodes of lightheadedness and near syncope.   Patient reports experiencing recurrent episodes of lightheadedness with near syncope.  He checked his vitals during 1 of these episodes a couple days ago and noted his heart rate to be in the 30s on his blood pressure monitor.  Since then, he has been using a pulse oximeter to check his heart rate during the episodes and has found it to be in the 30s.  He usually recovers quickly after sitting down.  He has never experienced any chest discomfort associated with this.  He denies any recent illness, fever, chills, insect bite, or new medications.  ED Course: Upon arrival to the ED, patient is found to be afebrile and saturating well on room air with heart rate in the 50s and elevated blood pressure.  Labs are most notable for alkaline phosphatase 149, AST 578, ALT 646, normal troponin, normal BNP, potassium 3.8, and magnesium 2.0.  EKG demonstrates sinus rhythm with rate 58.  Cardiology was consulted by the ED physician and medical admission was recommended.  Review of Systems:  All other systems reviewed and apart from HPI, are negative.  Past Medical History:  Diagnosis Date   Asthma    Diabetes (HCC)    Headache     Past Surgical History:  Procedure Laterality Date   TONSILLECTOMY     tubes in ears      Social History:   reports that he has never smoked. He has never used smokeless tobacco. He reports that he does not drink alcohol and does not use drugs.  Allergies  Allergen Reactions    Hydrocodone Anaphylaxis   Empagliflozin  Nausea Only   Metformin Nausea Only   Tramadol Nausea Only    Family History  Problem Relation Age of Onset   Diabetes Mother    Aneurysm Mother    Cerebral aneurysm Mother 10       Died suddenly   Other Father        died in house fire   Cancer Maternal Grandmother        unsure of type     Prior to Admission medications   Medication Sig Start Date End Date Taking? Authorizing Provider  albuterol  (VENTOLIN  HFA) 108 (90 Base) MCG/ACT inhaler Inhale 2 puffs into the lungs every 4 (four) hours as needed for wheezing or shortness of breath. 09/20/22   Plotnikov, Oakley Bellman, MD  Cholecalciferol  (VITAMIN D3) 50 MCG (2000 UT) capsule Take 1 capsule (2,000 Units total) by mouth daily. 10/04/22   Plotnikov, Oakley Bellman, MD  Cyanocobalamin  (VITAMIN B-12) 5000 MCG SUBL Place 1 tablet (5,000 mcg total) under the tongue daily. 10/04/22   Plotnikov, Aleksei V, MD  loratadine  (CLARITIN ) 10 MG tablet Take 1 tablet (10 mg total) by mouth daily. 09/20/22   Plotnikov, Aleksei V, MD  LORazepam  (ATIVAN ) 1 MG tablet Take 1 tablet (1 mg total) by mouth 2 (two) times daily as needed for anxiety. 11/08/22   Plotnikov, Aleksei V, MD  SYRINGE-NEEDLE, DISP, 3 ML (B-D 3CC LUER-LOK SYR 18GX1-1/2) 18G X 1-1/2" 3 ML  MISC Use once a week to draw up medication. 03/22/23     NEEDLE, DISP, 23 G 23G X 1" MISC Use to inject testosterone  into the muscle once a week. 03/22/23     omeprazole  (PRILOSEC) 20 MG capsule Take 1 capsule (20 mg total) by mouth once daily, 1/2 to 1 hour before morning meal. 04/20/23     omeprazole  (PRILOSEC) 40 MG capsule Take 1 capsule (40 mg total) by mouth 2 (two) times daily. 06/13/23 07/14/23    ondansetron  (ZOFRAN ) 4 MG tablet Take 1 tablet (4 mg total) by mouth daily as needed for nausea. 05/30/23     sucralfate  (CARAFATE ) 1 g tablet Take 1 tablet (1 g total) by mouth 2 (two) times daily on an empty stomach 08/17/23     valsartan  (DIOVAN ) 80 MG tablet Take 1.5 tablets  (120 mg total) by mouth daily. 07/02/23       Physical Exam: Vitals:   08/21/23 2040 08/21/23 2041 08/21/23 2045  BP:  (!) 152/86 (!) 160/95  Pulse:  (!) 57 (!) 58  Resp:  (!) 21 12  Temp:  97.8 F (36.6 C)   TempSrc:  Oral   SpO2:  100% 100%  Weight: 70.8 kg    Height: 5\' 6"  (1.676 m)      Constitutional: NAD, no pallor or diaphoresis   Eyes: PERTLA, lids and conjunctivae normal ENMT: Mucous membranes are moist. Posterior pharynx clear of any exudate or lesions.   Neck: supple, no masses  Respiratory: no wheezing, no crackles. No accessory muscle use.  Cardiovascular: S1 & S2 heard, regular rate and rhythm. No extremity edema.   Abdomen: No tenderness, soft. Bowel sounds active.  Musculoskeletal: no clubbing / cyanosis. No joint deformity upper and lower extremities.   Skin: no significant rashes, lesions, ulcers. Warm, dry, well-perfused. Neurologic: CN 2-12 grossly intact. Moving all extremities. Alert and oriented.  Psychiatric: Calm. Cooperative.    Labs and Imaging on Admission: I have personally reviewed following labs and imaging studies  CBC: Recent Labs  Lab 08/20/23 1104 08/21/23 2050  WBC 3.9* 3.5*  HGB 14.4 14.8  HCT 44.6 44.3  MCV 89.2 89.3  PLT 200 182   Basic Metabolic Panel: Recent Labs  Lab 08/20/23 1104 08/21/23 2050  NA 138 137  K 4.2 3.8  CL 103 100  CO2 26 24  GLUCOSE 111* 128*  BUN 10 7  CREATININE 1.08 1.13  CALCIUM 9.4 9.3  MG  --  2.0  PHOS  --  4.3   GFR: Estimated Creatinine Clearance: 76.1 mL/min (by C-G formula based on SCr of 1.13 mg/dL). Liver Function Tests: Recent Labs  Lab 08/20/23 1104 08/21/23 2050  AST 635* 578*  ALT 680* 646*  ALKPHOS 148* 149*  BILITOT 1.7* 1.2  PROT 7.0 7.3  ALBUMIN 4.0 4.1   Recent Labs  Lab 08/21/23 2050  LIPASE 40   No results for input(s): "AMMONIA" in the last 168 hours. Coagulation Profile: No results for input(s): "INR", "PROTIME" in the last 168 hours. Cardiac Enzymes: No  results for input(s): "CKTOTAL", "CKMB", "CKMBINDEX", "TROPONINI" in the last 168 hours. BNP (last 3 results) No results for input(s): "PROBNP" in the last 8760 hours. HbA1C: No results for input(s): "HGBA1C" in the last 72 hours. CBG: No results for input(s): "GLUCAP" in the last 168 hours. Lipid Profile: No results for input(s): "CHOL", "HDL", "LDLCALC", "TRIG", "CHOLHDL", "LDLDIRECT" in the last 72 hours. Thyroid Function Tests: Recent Labs    08/21/23 2050  TSH  1.827   Anemia Panel: No results for input(s): "VITAMINB12", "FOLATE", "FERRITIN", "TIBC", "IRON", "RETICCTPCT" in the last 72 hours. Urine analysis:    Component Value Date/Time   COLORURINE YELLOW 08/21/2023 2122   APPEARANCEUR CLEAR 08/21/2023 2122   LABSPEC 1.010 08/21/2023 2122   PHURINE 7.0 08/21/2023 2122   GLUCOSEU NEGATIVE 08/21/2023 2122   GLUCOSEU NEGATIVE 10/03/2022 0848   HGBUR NEGATIVE 08/21/2023 2122   BILIRUBINUR NEGATIVE 08/21/2023 2122   KETONESUR NEGATIVE 08/21/2023 2122   PROTEINUR NEGATIVE 08/21/2023 2122   UROBILINOGEN 0.2 10/03/2022 0848   NITRITE NEGATIVE 08/21/2023 2122   LEUKOCYTESUR NEGATIVE 08/21/2023 2122   Sepsis Labs: @LABRCNTIP (procalcitonin:4,lacticidven:4) )No results found for this or any previous visit (from the past 240 hours).   Radiological Exams on Admission: No results found.  EKG: Independently reviewed. Sinus rhythm, rate 58.   Assessment/Plan   1. Near-syncope; bradycardia  - TSH is normal in ED, potassium is 3.8, mag 2.0 - Continue cardiac monitoring, check echocardiogram, follow-up on cardiology recommendations   2. Type 2 DM  - Diet-controlled at home  - Check CBGs and use low-intensity SSI if needed   3. Hypertension  - Continue ARB   4. Elevated LFTs  - Undergoing outpatient workup, appears stable     DVT prophylaxis: Lovenox  Code Status: Full  Level of Care: Level of care: Telemetry Cardiac Family Communication: Wife at bedside   Disposition  Plan:  Patient is from: Home  Anticipated d/c is to: Home  Anticipated d/c date is: 08/22/23  Patient currently: Pending cardiac monitoring, echo Consults called: Cardiology  Admission status: Observation     Walton Guppy, MD [facility]  08/21/2023, 11:26 PM

## 2023-08-21 NOTE — ED Notes (Signed)
 Contacted lab to add on BNP.

## 2023-08-21 NOTE — ED Triage Notes (Signed)
 Pt returns today due to bradycardia (30s). Pt was seen here yesterday due to elevated liver enzymes, pt went to [facility] this morning for a procedure and they could not do the procedure due to pts bradycardia. Pt c.o intermittent light headedness during bradycardia episodes. Pt denies chest pain.

## 2023-08-21 NOTE — ED Notes (Signed)
 Contacted CCMD for monitoring

## 2023-08-21 NOTE — ED Notes (Signed)
 Pt was due for liver biopsy at [facility] this morning. At the time, MD found pulse at rate of 30 (asymptomatic bradycardia). Reports ongoing elevated liver enzymes for 2-3 months for unknown cause with weight loss. Today, came home took a nap after being in the ER most the day then appt at [facility] this morning. After waking up pt went outside to walk a little, felt weakness and lightheaded, questionable LOC, no falls.

## 2023-08-21 NOTE — ED Provider Notes (Signed)
 [place] EMERGENCY DEPARTMENT AT [facility] Provider Note   CSN: [id number] Arrival date & time: 08/21/23  2011     History  Chief Complaint  Patient presents with   Bradycardia    Dennis Simon is a 43 y.o. male.  The history is provided by the patient and medical records. No language interpreter was used.  Near Syncope This is a new problem. The current episode started 2 days ago. The problem has not changed since onset.Pertinent negatives include no chest pain, no abdominal pain, no headaches and no shortness of breath. Nothing aggravates the symptoms. The symptoms are relieved by lying down. He has tried nothing for the symptoms. The treatment provided no relief.       Home Medications Prior to Admission medications   Medication Sig Start Date End Date Taking? Authorizing Provider  albuterol  (VENTOLIN  HFA) 108 (90 Base) MCG/ACT inhaler Inhale 2 puffs into the lungs every 4 (four) hours as needed for wheezing or shortness of breath. 09/20/22   Plotnikov, Oakley Bellman, MD  Cholecalciferol  (VITAMIN D3) 50 MCG (2000 UT) capsule Take 1 capsule (2,000 Units total) by mouth daily. 10/04/22   Plotnikov, Oakley Bellman, MD  Cyanocobalamin  (VITAMIN B-12) 5000 MCG SUBL Place 1 tablet (5,000 mcg total) under the tongue daily. 10/04/22   Plotnikov, Oakley Bellman, MD  empagliflozin  (JARDIANCE ) 10 MG TABS tablet Take 1 tablet (10 mg total) by mouth daily. 04/06/23     loratadine  (CLARITIN ) 10 MG tablet Take 1 tablet (10 mg total) by mouth daily. 09/20/22   Plotnikov, Aleksei V, MD  LORazepam  (ATIVAN ) 1 MG tablet Take 1 tablet (1 mg total) by mouth 2 (two) times daily as needed for anxiety. 11/08/22   Plotnikov, Aleksei V, MD  SYRINGE-NEEDLE, DISP, 3 ML (B-D 3CC LUER-LOK SYR 18GX1-1/2) 18G X 1-1/2" 3 ML MISC Use once a week to draw up medication. 03/22/23     NEEDLE, DISP, 23 G 23G X 1" MISC Use to inject testosterone  into the muscle once a week. 03/22/23     omeprazole  (PRILOSEC) 20 MG capsule  Take 1 capsule (20 mg total) by mouth once daily, 1/2 to 1 hour before morning meal. 04/20/23     omeprazole  (PRILOSEC) 40 MG capsule Take 1 capsule (40 mg total) by mouth 2 (two) times daily. 06/13/23 07/14/23    ondansetron  (ZOFRAN ) 4 MG tablet Take 1 tablet (4 mg total) by mouth daily as needed for nausea. 05/30/23     rizatriptan  (MAXALT -MLT) 10 MG disintegrating tablet Take 1 tablet (10 mg total) by mouth as needed. May repeat in 2 hours if needed Patient not taking: Reported on 09/20/2022 09/08/21   Phebe Brasil, MD  sucralfate  (CARAFATE ) 1 g tablet Take 1 tablet (1 g total) by mouth 2 (two) times daily on an empty stomach 08/17/23     testosterone  cypionate (DEPOTESTOSTERONE CYPIONATE) 200 MG/ML injection Inject 0.5 mLs (100 mg total) into the muscle once a week. 03/22/23     valsartan  (DIOVAN ) 80 MG tablet Take 1 tablet (80 mg total) by mouth daily. 09/26/22   Plotnikov, Oakley Bellman, MD  valsartan  (DIOVAN ) 80 MG tablet Take 1.5 tablets (120 mg total) by mouth daily. 07/02/23     Vitamin D , Ergocalciferol , (DRISDOL ) 1.25 MG (50000 UNIT) CAPS capsule Take 1 capsule (50,000 Units total) by mouth every 7 (seven) days. 10/04/22   Plotnikov, Oakley Bellman, MD      Allergies    Hydrocodone    Review of Systems   Review  of Systems  Constitutional:  Positive for fatigue. Negative for chills and fever.  HENT:  Negative for congestion.   Respiratory:  Negative for cough, chest tightness, shortness of breath and wheezing.   Cardiovascular:  Positive for near-syncope. Negative for chest pain, palpitations and leg swelling.  Gastrointestinal:  Positive for nausea and vomiting. Negative for abdominal pain, constipation and diarrhea.  Genitourinary:  Negative for dysuria.  Musculoskeletal:  Negative for back pain, neck pain and neck stiffness.  Skin:  Negative for rash and wound.  Neurological:  Negative for light-headedness and headaches.  Psychiatric/Behavioral:  Negative for agitation and confusion.   All other  systems reviewed and are negative.   Physical Exam Updated Vital Signs Ht 5\' 6"  (1.676 m)   Wt 70.8 kg   BMI 25.18 kg/m  Physical Exam Vitals and nursing note reviewed.  Constitutional:      General: He is not in acute distress.    Appearance: He is well-developed. He is not ill-appearing, toxic-appearing or diaphoretic.  HENT:     Head: Normocephalic and atraumatic.     Nose: No congestion or rhinorrhea.     Mouth/Throat:     Mouth: Mucous membranes are moist.     Pharynx: No oropharyngeal exudate or posterior oropharyngeal erythema.  Eyes:     Extraocular Movements: Extraocular movements intact.     Conjunctiva/sclera: Conjunctivae normal.     Pupils: Pupils are equal, round, and reactive to light.  Cardiovascular:     Rate and Rhythm: Regular rhythm. Bradycardia present.     Heart sounds: No murmur heard. Pulmonary:     Effort: Pulmonary effort is normal. No respiratory distress.     Breath sounds: Normal breath sounds. No wheezing, rhonchi or rales.  Chest:     Chest wall: No tenderness.  Abdominal:     General: Abdomen is flat.     Palpations: Abdomen is soft.     Tenderness: There is no abdominal tenderness.  Musculoskeletal:        General: No swelling or tenderness.     Cervical back: Neck supple.     Right lower leg: No edema.     Left lower leg: No edema.  Skin:    General: Skin is warm and dry.     Capillary Refill: Capillary refill takes less than 2 seconds.     Findings: No erythema or rash.  Neurological:     General: No focal deficit present.     Mental Status: He is alert.     Sensory: No sensory deficit.     Motor: No weakness.  Psychiatric:        Mood and Affect: Mood normal.     ED Results / Procedures / Treatments   Labs (all labs ordered are listed, but only abnormal results are displayed) Labs Reviewed  BASIC METABOLIC PANEL WITH GFR - Abnormal; Notable for the following components:      Result Value   Glucose, Bld 128 (*)    All  other components within normal limits  CBC - Abnormal; Notable for the following components:   WBC 3.5 (*)    All other components within normal limits  HEPATIC FUNCTION PANEL - Abnormal; Notable for the following components:   AST 578 (*)    ALT 646 (*)    Alkaline Phosphatase 149 (*)    Bilirubin, Direct 0.4 (*)    All other components within normal limits  LIPASE, BLOOD  MAGNESIUM  PHOSPHORUS  URINALYSIS, W/ REFLEX TO  CULTURE (INFECTION SUSPECTED)  TSH  BRAIN NATRIURETIC PEPTIDE  TROPONIN I (HIGH SENSITIVITY)    EKG EKG Interpretation Date/Time:  Tuesday Aug 21 2023 20:42:00 EDT Ventricular Rate:  58 PR Interval:  125 QRS Duration:  95 QT Interval:  436 QTC Calculation: 429 R Axis:   41  Text Interpretation: Sinus rhythm sinus bradycardia similar to prior No STEMI Confirmed by Wynell Heath ([id number]) on 08/21/2023 8:42:54 PM  Radiology No results found.  Procedures Procedures    Medications Ordered in ED Medications - No data to display  ED Course/ Medical Decision Making/ A&P                                 Medical Decision Making Amount and/or Complexity of Data Reviewed Labs: ordered.  Risk Decision regarding hospitalization.    ELZA ORN is a 42 y.o. male with past medical history significant for migraines, anxiety, vertigo, and diabetes who is currently in the process of being worked up for LFT elevation who presents for near syncope and symptomatic bradycardia.  According to patient, for the last month or so he has been getting worked up for liver function elevation.  He does not drink alcohol, has not used Tylenol, and has not forged for mushrooms.  He denies any medications that could have caused this.  For the last 2 days he has been having episodes of near syncope and lightheadedness and slow heart rates.  He was seen yesterday and had a reassuring workup and was able to go home as he was no longer bradycardic as he was supposed to have a liver  biopsy at [facility] today.  Patient reports that throughout the day he has had multiple episodes of near syncope and slow heart rate into the low 30s and his biopsy was canceled at [facility] due to these episodes.  Patient reports that this evening he had another episode where he was near syncopal and heart rate had gone down into the 30s.  He has no history of this.  He has not fully lost consciousness but did say that he fell asleep very hard/passed out after getting home from [facility] but does not think it was cardiac in nature.  He reports no fevers or chills and has no other congestion or cough.  He denies chest pain with it and does not feel palpitations.  He reports some nausea and vomiting earlier this evening but not now.  He denies any constipation, diarrhea, or urinary changes from his baseline.  Denies any leg pain or leg swelling and denies any tick exposures.  Denies rashes.  Reports feeling slightly fatigued.  On exam, lungs clear.  Chest nontender.  Abdomen nontender.  Good pulses in extremities.  No focal neurologic deficits.  Patient resting now.  EKG showed a sinus rhythm without A-fib or other arrhythmia initially.  Clinically I am concerned about these recurrent episodes of near syncope, lightheadedness, and this intermittent bradycardia.  We have not yet captured on the monitor or EKG but I am concerned about the recurrent episodes for the last 48 hours.  I called cardiology who agreed that the patient likely needs a medicine admission and they will see the patient.  He may need echo and further workup.  Will get screening labs and will call for medicine admission after cardiology sees him.  Anticipate reassessment after workup.     9:48 PM Workup continues to return.  LFTs are  elevated but similar to prior.  Urinalysis reassuring.  Magnesium normal.  Phosphorus normal.  Lipase normal.  Initial troponin normal.  BMP does not show critical abnormality and CBC did not show anemia.  Normal  platelets.  BNP in process.  Cardiology the patient however I do not feel safe with him going home and cardiology agreed.  Will call medicine for admission.         Final Clinical Impression(s) / ED Diagnoses Final diagnoses:  Near syncope  Bradycardia    Rx / DC Orders ED Discharge Orders     None       Clinical Impression: 1. Near syncope   2. Bradycardia     Disposition: Admit  This note was prepared with assistance of Dragon voice recognition software. Occasional wrong-word or sound-a-like substitutions may have occurred due to the inherent limitations of voice recognition software.      Blair Lundeen, Marine Sia, MD 08/21/23 2149

## 2023-08-22 ENCOUNTER — Observation Stay (HOSPITAL_COMMUNITY)

## 2023-08-22 ENCOUNTER — Other Ambulatory Visit: Payer: Self-pay | Admitting: Pulmonary Disease

## 2023-08-22 ENCOUNTER — Observation Stay

## 2023-08-22 DIAGNOSIS — R001 Bradycardia, unspecified: Secondary | ICD-10-CM

## 2023-08-22 DIAGNOSIS — R55 Syncope and collapse: Secondary | ICD-10-CM | POA: Diagnosis not present

## 2023-08-22 DIAGNOSIS — I10 Essential (primary) hypertension: Secondary | ICD-10-CM | POA: Diagnosis not present

## 2023-08-22 DIAGNOSIS — R7401 Elevation of levels of liver transaminase levels: Secondary | ICD-10-CM | POA: Diagnosis not present

## 2023-08-22 DIAGNOSIS — E119 Type 2 diabetes mellitus without complications: Secondary | ICD-10-CM | POA: Diagnosis not present

## 2023-08-22 LAB — CBG MONITORING, ED
Glucose-Capillary: 85 mg/dL (ref 70–99)
Glucose-Capillary: 75 mg/dL (ref 70–99)

## 2023-08-22 LAB — CBC
HCT: 38 % — ABNORMAL LOW (ref 39.0–52.0)
Hemoglobin: 12.7 g/dL — ABNORMAL LOW (ref 13.0–17.0)
MCH: 30 pg (ref 26.0–34.0)
MCHC: 33.4 g/dL (ref 30.0–36.0)
MCV: 89.8 fL (ref 80.0–100.0)
Platelets: 159 10*3/uL (ref 150–400)
RBC: 4.23 MIL/uL (ref 4.22–5.81)
RDW: 13.7 % (ref 11.5–15.5)
WBC: 3.2 10*3/uL — ABNORMAL LOW (ref 4.0–10.5)
nRBC: 0 % (ref 0.0–0.2)

## 2023-08-22 LAB — COMPREHENSIVE METABOLIC PANEL WITH GFR
ALT: 559 U/L — ABNORMAL HIGH (ref 0–44)
AST: 504 U/L — ABNORMAL HIGH (ref 15–41)
Albumin: 3.5 g/dL (ref 3.5–5.0)
Alkaline Phosphatase: 136 U/L — ABNORMAL HIGH (ref 38–126)
Anion gap: 8 (ref 5–15)
BUN: 11 mg/dL (ref 6–20)
CO2: 24 mmol/L (ref 22–32)
Calcium: 9 mg/dL (ref 8.9–10.3)
Chloride: 106 mmol/L (ref 98–111)
Creatinine, Ser: 1.01 mg/dL (ref 0.61–1.24)
GFR, Estimated: >60 mL/min (ref >60)
Glucose, Bld: 107 mg/dL — ABNORMAL HIGH (ref 70–99)
Potassium: 3.7 mmol/L (ref 3.5–5.1)
Sodium: 138 mmol/L (ref 135–145)
Total Bilirubin: 1.2 mg/dL (ref 0.0–1.2)
Total Protein: 6.3 g/dL — ABNORMAL LOW (ref 6.5–8.1)

## 2023-08-22 LAB — TROPONIN I (HIGH SENSITIVITY): Troponin I (High Sensitivity): 3 ng/L (ref <18)

## 2023-08-22 LAB — ECHOCARDIOGRAM COMPLETE
Height: 66 in
S' Lateral: 2.4 cm
Weight: 2472.68 [oz_av]

## 2023-08-22 LAB — MRSA NEXT GEN BY PCR, NASAL: MRSA by PCR Next Gen: NOT DETECTED

## 2023-08-22 LAB — GLUCOSE, CAPILLARY
Glucose-Capillary: 71 mg/dL (ref 70–99)
Glucose-Capillary: 120 mg/dL — ABNORMAL HIGH (ref 70–99)

## 2023-08-22 LAB — HEMOGLOBIN A1C
Hgb A1c MFr Bld: 5.3 % (ref 4.8–5.6)
Mean Plasma Glucose: 105.41 mg/dL

## 2023-08-22 LAB — HIV ANTIBODY (ROUTINE TESTING W REFLEX): HIV Screen 4th Generation wRfx: NONREACTIVE

## 2023-08-22 MED ORDER — IBUPROFEN 200 MG PO TABS
400.0000 mg | ORAL_TABLET | Freq: Four times a day (QID) | ORAL | Status: DC | PRN
  Administered 2023-08-22: 400 mg via ORAL
  Filled 2023-08-22: qty 1

## 2023-08-22 MED ORDER — POTASSIUM CHLORIDE CRYS ER 20 MEQ PO TBCR
20.0000 meq | EXTENDED_RELEASE_TABLET | Freq: Once | ORAL | Status: AC
  Administered 2023-08-22: 20 meq via ORAL
  Filled 2023-08-22: qty 1

## 2023-08-22 MED ORDER — SODIUM CHLORIDE 0.9 % IV BOLUS
1000.0000 mL | Freq: Once | INTRAVENOUS | Status: AC
Start: 2023-08-22 — End: 2023-08-22
  Administered 2023-08-22: 1000 mL via INTRAVENOUS

## 2023-08-22 MED ORDER — ACETAMINOPHEN 325 MG PO TABS: 650.0000 mg | ORAL_TABLET | ORAL | Status: DC | PRN

## 2023-08-22 NOTE — Progress Notes (Signed)
 CCMD called for pt HR drop to 37.  Patient assessed, denies CP, dizziness or any distress.  BP 131/88, HR 42 SB.  Plan of care on going.

## 2023-08-22 NOTE — Plan of Care (Signed)
  Problem: Education: Goal: Ability to describe self-care measures that may prevent or decrease complications (Diabetes Survival Skills Education) will improve Outcome: Progressing Goal: Individualized Educational Video(s) Outcome: Progressing   Problem: Coping: Goal: Ability to adjust to condition or change in health will improve Outcome: Progressing   Problem: Fluid Volume: Goal: Ability to maintain a balanced intake and output will improve Outcome: Progressing   Problem: Health Behavior/Discharge Planning: Goal: Ability to identify and utilize available resources and services will improve Outcome: Progressing Goal: Ability to manage health-related needs will improve Outcome: Progressing   Problem: Metabolic: Goal: Ability to maintain appropriate glucose levels will improve Outcome: Progressing   Problem: Nutritional: Goal: Maintenance of adequate nutrition will improve Outcome: Progressing Goal: Progress toward achieving an optimal weight will improve Outcome: Progressing   Problem: Skin Integrity: Goal: Risk for impaired skin integrity will decrease Outcome: Progressing   Problem: Tissue Perfusion: Goal: Adequacy of tissue perfusion will improve Outcome: Progressing   Problem: Education: Goal: Knowledge of condition and prescribed therapy will improve Outcome: Progressing   Problem: Cardiac: Goal: Will achieve and/or maintain adequate cardiac output Outcome: Progressing   Problem: Physical Regulation: Goal: Complications related to the disease process, condition or treatment will be avoided or minimized Outcome: Progressing   Problem: Education: Goal: Knowledge of General Education information will improve Description: Including pain rating scale, medication(s)/side effects and non-pharmacologic comfort measures Outcome: Progressing   Problem: Health Behavior/Discharge Planning: Goal: Ability to manage health-related needs will improve Outcome:  Progressing   Problem: Clinical Measurements: Goal: Ability to maintain clinical measurements within normal limits will improve Outcome: Progressing Goal: Will remain free from infection Outcome: Progressing Goal: Diagnostic test results will improve Outcome: Progressing Goal: Respiratory complications will improve Outcome: Progressing Goal: Cardiovascular complication will be avoided Outcome: Progressing   Problem: Activity: Goal: Risk for activity intolerance will decrease Outcome: Progressing   Problem: Nutrition: Goal: Adequate nutrition will be maintained Outcome: Progressing   Problem: Coping: Goal: Level of anxiety will decrease Outcome: Progressing   Problem: Elimination: Goal: Will not experience complications related to bowel motility Outcome: Progressing Goal: Will not experience complications related to urinary retention Outcome: Progressing   Problem: Pain Managment: Goal: General experience of comfort will improve and/or be controlled Outcome: Progressing   Problem: Safety: Goal: Ability to remain free from injury will improve Outcome: Progressing   Problem: Skin Integrity: Goal: Risk for impaired skin integrity will decrease Outcome: Progressing

## 2023-08-22 NOTE — Consult Note (Signed)
 Cardiology Consultation   Patient ID: ABDULLAHI CARTEN MRN: [id number]; DOB: 08/11/1980  Admit date: 08/21/2023 Date of Consult: 08/22/2023  PCP:  Genia Kettering, MD   [facility] Providers Cardiologist:  None        Patient Profile:   Dennis Simon is a 43 y.o. male with a hx of hypertension, DM 2, GERD, migraines, celiac disease, history of Candida esophagitis and recent history of hepatocellular transaminitis who is being seen 08/22/2023 for the evaluation of bradycardia at the request of Dr. Manus Sellers.  History of Present Illness:   Mr. Peric states that he was in his usual state of health until about 1-2 weeks ago.  Around that time, he for started noticing lightheadedness.  He states that that happens on a fairly regular basis (at least once daily) with no readily identifiable inciting/aggravating factors.  He started checking his pulse with a pulse ox and found that his symptoms corresponded to a heart rate in the 30s on pulse ox.  Symptoms typically dissipated spontaneously, usually when in a seated position.  Recently, while at work, he had acute onset of symptoms of presyncope.  Per his recollection, symptoms lasted longer prompting him to present to the emergency room on 08/20/2023 where he received IVF and was discharged with plans for cardiac monitor and follow-up with cardiology.  He had plans for a liver biopsy at [facility] on 08/21/2023 due to recent history of hepatocellular transaminitis of unclear etiology.  In the preprocedural setting, he was noted to be bradycardic with symptoms of presyncope and therefore this procedure was postponed by his medical team.  He tells me today that he was told that his heart rate was 60 and his pulse was 30 on the cardiac monitor while in the preprocedure suite.  On returning home, he had another episode of lightheadedness which prompted him to present to the emergency room.  In the emergency room, afebrile, HR  40s-50s, BP 120-160s/70-90s, not requiring supplemental oxygen.  CBC unremarkable.  CMP with no electrolyte derangements, normal renal function AST/ALT 578/646, T. bili 1.2, alk phos 149.  BNP 53.  High-sensitivity troponin 3-3.  UA normal.  EKG with sinus bradycardia.  Past Medical History:  Diagnosis Date   Asthma    Diabetes (HCC)    Headache     Past Surgical History:  Procedure Laterality Date   TONSILLECTOMY     tubes in ears       Home Medications:  Prior to Admission medications   Medication Sig Start Date End Date Taking? Authorizing Provider  ibuprofen  (ADVIL ) 200 MG tablet Take 400 mg by mouth every 6 (six) hours as needed for headache.   Yes [provider]  omeprazole  (PRILOSEC) 40 MG capsule Take 1 capsule (40 mg total) by mouth 2 (two) times daily. 06/13/23 01/15/24 Yes   valsartan  (DIOVAN ) 80 MG tablet Take 1.5 tablets (120 mg total) by mouth daily. 07/02/23  Yes   albuterol  (VENTOLIN  HFA) 108 (90 Base) MCG/ACT inhaler Inhale 2 puffs into the lungs every 4 (four) hours as needed for wheezing or shortness of breath. 09/20/22   Plotnikov, Aleksei V, MD  sucralfate  (CARAFATE ) 1 g tablet Take 1 tablet (1 g total) by mouth 2 (two) times daily on an empty stomach Patient not taking: Reported on 08/21/2023 08/17/23       Inpatient Medications: Scheduled Meds:  enoxaparin (LOVENOX) injection  40 mg Subcutaneous Q24H   insulin  aspart  0-5 Units Subcutaneous QHS  insulin  aspart  0-6 Units Subcutaneous TID WC   irbesartan  75 mg Oral Daily   sodium chloride  flush  3 mL Intravenous Q12H   Continuous Infusions:  PRN Meds: acetaminophen **OR** acetaminophen, albuterol , ondansetron  **OR** ondansetron  (ZOFRAN ) IV, senna-docusate  Allergies:    Allergies  Allergen Reactions   Hydrocodone Anaphylaxis   Gluten Meal Nausea And Vomiting   Empagliflozin  Nausea Only   Metformin Nausea Only   Tramadol Nausea Only    Social History:   Social History   Socioeconomic History    Marital status: Legally Separated    Spouse name: Not on file   Number of children: 1   Years of education: 12   Highest education level: High school graduate  Occupational History   Occupation: facility maintenance tech  Tobacco Use   Smoking status: Never   Smokeless tobacco: Never  Substance and Sexual Activity   Alcohol use: No   Drug use: No   Sexual activity: Not on file  Other Topics Concern   Not on file  Social History Narrative   Lives at home alone.   Right-handed.   3-4 cups caffeine per day.   Social Drivers of Corporate investment banker Strain: Not on file  Food Insecurity: Not on file  Transportation Needs: Not on file  Physical Activity: Not on file  Stress: Not on file  Social Connections: Not on file  Intimate Partner Violence: Not on file    Family History:    Family History  Problem Relation Age of Onset   Diabetes Mother    Aneurysm Mother    Cerebral aneurysm Mother 69       Died suddenly   Other Father        died in house fire   Cancer Maternal Grandmother        unsure of type     ROS:  Please see the history of present illness.   All other ROS reviewed and negative.     Physical Exam/Data:   Vitals:   08/21/23 2200 08/21/23 2230 08/21/23 2300 08/21/23 2330  BP: 125/77 119/79 120/86 130/81  Pulse: (!) 51 (!) 51 (!) 53 (!) 53  Resp: 16 16 16 18   Temp:      TempSrc:      SpO2: 100% 100% 100% 100%  Weight:      Height:       No intake or output data in the 24 hours ending 08/22/23 0031    08/21/2023    8:40 PM 11/08/2022   10:26 AM 09/26/2022   10:31 AM  Last 3 Weights  Weight (lbs) 156 lb 182 lb 177 lb  Weight (kg) 70.761 kg 82.555 kg 80.287 kg     Body mass index is 25.18 kg/m.  General:  Well nourished, well developed, in no acute distress HEENT: normal Neck: no JVD Vascular: No carotid bruits; Distal pulses 2+ bilaterally Cardiac:  bradycardic and without murmur Lungs:  clear to auscultation bilaterally, no  wheezing, rhonchi or rales  Abd: soft, nontender, no hepatomegaly  Ext: no edema Musculoskeletal:  No deformities, BUE and BLE strength normal and equal Skin: warm and dry  Neuro:  CNs 2-12 intact, no focal abnormalities noted Psych:  Normal affect   EKG:  The EKG was personally reviewed and demonstrates:  sinus bradycardia Telemetry:  Telemetry was personally reviewed and demonstrates:  sinus bradycardia with intermittent periods of bigeminy  Relevant CV Studies: none  Laboratory Data:  High Sensitivity Troponin:  Recent Labs  Lab 08/21/23 2050 08/21/23 2252  TROPONINIHS 3 3     Chemistry Recent Labs  Lab 08/20/23 1104 08/21/23 2050  NA 138 137  K 4.2 3.8  CL 103 100  CO2 26 24  GLUCOSE 111* 128*  BUN 10 7  CREATININE 1.08 1.13  CALCIUM 9.4 9.3  MG  --  2.0  GFRNONAA >60 >60  ANIONGAP 9 13    Recent Labs  Lab 08/20/23 1104 08/21/23 2050  PROT 7.0 7.3  ALBUMIN 4.0 4.1  AST 635* 578*  ALT 680* 646*  ALKPHOS 148* 149*  BILITOT 1.7* 1.2   Lipids No results for input(s): "CHOL", "TRIG", "HDL", "LABVLDL", "LDLCALC", "CHOLHDL" in the last 168 hours.  Hematology Recent Labs  Lab 08/20/23 1104 08/21/23 2050  WBC 3.9* 3.5*  RBC 5.00 4.96  HGB 14.4 14.8  HCT 44.6 44.3  MCV 89.2 89.3  MCH 28.8 29.8  MCHC 32.3 33.4  RDW 13.4 13.6  PLT 200 182   Thyroid  Recent Labs  Lab 08/21/23 2050  TSH 1.827    BNP Recent Labs  Lab 08/21/23 2050  BNP 53.5    DDimer No results for input(s): "DDIMER" in the last 168 hours.   Radiology/Studies:  No results found.   Assessment and Plan:   Sinus node dysfunction Concern for symptomatic bradycardia Presenting with 1-2 weeks of presyncope with associated reported bradycardia with rates as low as the 30s on home pulse ox.  EKG here consistent with sinus bradycardia with no signs of high grade AV block.  Workup unremarkable thus far.  Normal cardiac enzymes and BNP, TSH normal, no electrolyte derangements. No  obvious medication offenders.  No new OTC meds or drugs that may be contributing.  No signs of heart failure.  No recent viral symptoms to suggest myocarditis and cardiac enzymes are normal.  He has had previous EKG with sinus bradycardia in the past as well, so it is interesting that he is developing symptoms now and may suggest it is unrelated.  Has a new diagnosis of celiac and has been experiencing vomiting recently suggesting a vagal etiology, though no nausea/vomiting around recent presyncope events.  Review of telemetry shows period of bigeminy which likely explains why he was told recently he had a pulse of 60 and a HR of 30 at the same time.  An episode occurred during my exam and he was asymptomatic throughout.  For further workup, would start with remaining on telemetry and obtaining an echo.  Pending those results, could also consider a CMR to evaluate for infiltrative disease, if indicated.  Risk Assessment/Risk Scores:                For questions or updates, please contact [facility] Please consult [email or web address] for contact info under    Signed, Chandler Combs, MD  08/22/2023 12:31 AM

## 2023-08-22 NOTE — Progress Notes (Unsigned)
Enrolled for [employer] to mail a ZIO XT long term holter monitor to the patients address on file.   Dr. Haroldine Laws to read.

## 2023-08-22 NOTE — Consult Note (Addendum)
 ELECTROPHYSIOLOGY CONSULT NOTE    Patient ID: Dennis Simon MRN: [id number], DOB/AGE: Sep 07, 1980 43 y.o.  Admit date: 08/21/2023 Date of Consult: 08/22/2023  Primary Physician: Genia Kettering, MD Primary Cardiologist: None  Electrophysiologist: Dr. Lawana Pray, new consult 08/22/23   Referring Provider: Dr. Hildy Lowers  Patient Profile: Dennis Simon is a 43 y.o. male, works in maintenance at [employer], with a history of abnormal LFT's (pending biopsy at [facility]), bradycardia, HTN, DM II, GERD, migraines, candida esophagitis, recent diagnosis of celiac disease who is being seen today for the evaluation of bradycardia at the request of Dr. Hildy Lowers / TR[facility].  HPI:  Dennis Simon is a 43 y.o. male who presented to [facility] ER on 08/21/23 with reports of dizziness & lightheadedness.   Followed by [facility] and has been referred to [facility] for abnormal LFT's which were noted in 05/2023.  He underwent an EGD 06/29/23 (for dysphagia, n/v, early satiety, reflux) and was dx with candida esophagitis, celiac disease. Recent CT ABD showed normal appearance of liver, no masses, patent portal / hepatic veins & multiple prominent mesenteric lymph nodes within the left mesentery. He was pending a liver biopsy at [facility] but procedure was canceled due to telemetry showing bradycardia (pt thinks in the 30's, states there was a discrepancy between the pulse and the monitor). He has not had any episodes of syncope.  Reports only dizziness / lightheadedness.  Notes he has lost ~ 30lbs in the last 2-3 months.     He denies chest pain, palpitations, dyspnea, PND, orthopnea, nausea, vomiting, dizziness, syncope, edema, weight gain, or early satiety.   Labs Potassium3.7 (05/07 3086) Magnesium  2.0 (05/06 2050) Creatinine, ser  1.01 (05/07 0223) PLT  159 (05/07 0223) HGB  12.7* (05/07 0223) WBC 3.2* (05/07 0223) Troponin I (High Sensitivity)3 (05/06 2252).    Past Medical History:  Diagnosis Date   Asthma    Diabetes (HCC)     Headache      Surgical History:  Past Surgical History:  Procedure Laterality Date   TONSILLECTOMY     tubes in ears       (Not in a hospital admission)   Inpatient Medications:   enoxaparin (LOVENOX) injection  40 mg Subcutaneous Q24H   insulin  aspart  0-5 Units Subcutaneous QHS   insulin  aspart  0-6 Units Subcutaneous TID WC   irbesartan  75 mg Oral Daily   sodium chloride  flush  3 mL Intravenous Q12H    Allergies:  Allergies  Allergen Reactions   Hydrocodone Anaphylaxis   Gluten Meal Nausea And Vomiting   Empagliflozin  Nausea Only   Metformin Nausea Only   Tramadol Nausea Only    Family History  Problem Relation Age of Onset   Diabetes Mother    Aneurysm Mother    Cerebral aneurysm Mother 47       Died suddenly   Other Father        died in house fire   Cancer Maternal Grandmother        unsure of type     Physical Exam: Vitals:   08/22/23 0545 08/22/23 0600 08/22/23 0620 08/22/23 0625  BP:   112/75   Pulse: (!) 42 (!) 44 64   Resp: 14 14 18    Temp:    97.7 F (36.5 C)  TempSrc:    Oral  SpO2: 96% 96% 98%   Weight:      Height:        GEN- thin adult male lying  in bed in NAD, A&O x 3, normal affect HEENT: Normocephalic, atraumatic Lungs- CTAB, Normal effort.  Heart- Regular rate and rhythm, occ PVC noted on monitor, No M/G/R.  GI- Soft, NT, ND.  Extremities- No clubbing, cyanosis, or edema   Radiology/Studies: No results found.  EKG:SR 62 bpm with benign early repolarization (personally reviewed)  TELEMETRY: SB 40's - SR 50's, occ PVC (personally reviewed)  DEVICE HISTORY: n/a  Assessment/Plan:  Sinus Bradycardia  Dizziness / Lightheadedness  All EKG's in system reviewed, EKG 2024 shows bradycardia with benign early repolarization (unchanged from current EKG). Not on medications PTA that would slow rate. He has a recent diagnosis of Celiac Disease.    -continue tele monitoring  -assess ECHO to rule out myocarditis / DCM (can be  associated with Celiac) -Usiel Astarita plan for cardiac monitor at discharge for 2 weeks, Dr. Lawana Pray to review  -pending monitor results, may need EP follow up in clinic  Weight Loss  Celiac Disease ~30 lbs in last 2-3 months  -per primary    For questions or updates, please contact [facility] Please consult [email or web address] for contact info under Cardiology/STEMI.  Signed, Creighton Doffing, NP-C, AGACNP-BC [facility] - Electrophysiology  08/22/2023, 9:36 AM    I have seen and examined this patient with Creighton Doffing.  Agree with above, note added to reflect my findings.  Patient with a history of celiac disease and abnormal LFTs.  He was at [facility] about to get a liver biopsy.  He was found to be bradycardic at [facility] and biopsy was canceled.  He went home and felt dizzy.  Found his heart rates in the 30s.  He thus presented to the emergency room.  While in the emergency room, telemetry and EKG shows no bradycardia.  He feels well and is without acute complaint.  GEN: No acute distress.   Neck: No JVD Cardiac: Regular Respiratory: normal work of breathing MS: No edema; No deformity. Neuro:  Nonfocal  Skin: warm and dry Psych: Normal affect    Dizziness/lightheadedness: Patient has sinus bradycardia.  He has early repolarization on his EKG.  Despite this, he noted heart rates in the 30s at home.  He has had no bradycardia like this since being in the hospital.  Yarden Manuelito plan for 2-week monitor at discharge.  If echo shows a normal ejection fraction, would be okay with discharge.  We Burris Matherne arrange follow-up in clinic.  Rashee Marschall M. Trishelle Devora MD 08/22/2023 10:29 AM

## 2023-08-22 NOTE — TOC CM/SW Note (Signed)
 Transition of Care [facility]) - Inpatient Brief Assessment   Patient Details  Name: Dennis Simon MRN: [id number] Date of Birth: 09-05-1980  [facility] CM/SW Contact:    Juliane Och, LCSW Phone Number: 08/22/2023, 1:16 PM   Clinical Narrative:  1:16 PM Per chart review, patient has a PCP and insurance. Patient does not have HH/SNF/DME history. No TOC needs were identified at this time. TOC will continue to follow and be available to assist.  Transition of Care Asessment: Insurance and Status: Insurance coverage has been reviewed Patient has primary care physician: Yes Home environment has been reviewed: Private Residence Prior level of function:: N/A Prior/Current Home Services: No current home services Social Drivers of Health Review: SDOH reviewed no interventions necessary Readmission risk has been reviewed: Yes Transition of care needs: no transition of care needs at this time

## 2023-08-22 NOTE — Progress Notes (Signed)
 Patient seen inpatient for bradycardia, lightheadedness / dizziness. No syncope.    14 day cardiac monitor.  Dr. Lawana Pray to read.     Creighton Doffing, NP-C, AGACNP-BC [facility] - Electrophysiology  08/22/2023, 10:00 AM

## 2023-08-22 NOTE — ED Notes (Signed)
 Central monitoring called this RN to notify that at 0320am pt had an event where the HR dropped into 30s and then came back up into 40s and 50s. They reported similar event happened at 0407am,

## 2023-08-22 NOTE — Progress Notes (Signed)
 PROGRESS NOTE    Dennis Simon  ZOX:[id number] DOB: 08-Nov-1980 DOA: 08/21/2023 PCP: Genia Kettering, MD   Chief Complaint  Patient presents with   Bradycardia    Brief Narrative:  Patient is a pleasant 43 year old gentleman history of hypertension, type 2 diabetes, migraines, anxiety, elevated LFTs currently being worked up via [facility] presented with complaints of bradycardia, episodes of lightheadedness and near syncope.  Patient noted to have been experiencing recurrent episodes of lightheadedness and near syncope and noted at the time heart rates to be in the 30s on his blood pressure monitor.  Patient noted to have been set up for biopsy of the liver for further evaluation of elevated LFTs however noted to be bradycardic during that time and procedure canceled/postponed.  Patient seen in the ED, cardiology consulted for further evaluation.   Assessment & Plan:   Principal Problem:   Near syncope Active Problems:   Diabetes type 2, controlled (HCC)   HTN (hypertension)   Anxiety   Elevated transaminase level   Bradycardia  #1 near syncope concern for symptomatic bradycardia -Patient presented with episodes of recurrent lightheadedness, near syncope noted at the time to be having heart rates in the 30s per his blood pressure monitor. - Patient also noted for several weeks prior to being diagnosed with celiac disease with episodes of nausea and vomiting. - TSH noted within normal limits. - Potassium at 3.8, magnesium at 2.0. - Currently on telemetry. - 2 D echo pending. -Check orthostatics. -Will give 1 L normal saline bolus x 1. - Patient seen in consultation by cardiology and EP who reviewed EKG and chart and recommended 2-week monitor on discharge pending 2D echo with close outpatient follow-up in clinic.  2.  Well-controlled diabetes mellitus type 2 -Hemoglobin A1c 5.3. - Diet controlled prior to admission. - CBG 85 this morning. - SSI.  3.   Hypertension -Continue Avapro 75 mg daily.  4.  Elevated LFTs -Being worked up in the outpatient setting. - Outpatient follow-up.   DVT prophylaxis: Lovenox Code Status: Full Family Communication: Updated patient and wife at bedside. Disposition:   Status is: Observation The patient remains OBS appropriate and will d/c before 2 midnights.   Consultants:  Cardiology: Dr. Hayes Lipps 08/22/2023 Electrophysiology: Dr. Lawana Pray  Procedures: 2D echo pending   Antimicrobials: Anti-infectives (From admission, onward)    None         Subjective: Patient lying in bed.  Feels well.  Denies any chest pain or shortness of breath.  Denies any lightheadedness or dizziness.  Wife at bedside very tearful and concerned.  Patient noted to have had several weeks of nausea and vomiting prior to being diagnosed with celiac disease.  Objective: Vitals:   08/22/23 0625 08/22/23 1007 08/22/23 1100 08/22/23 1217  BP:  120/84 128/77 125/86  Pulse:  (!) 47 (!) 48 (!) 46  Resp:  16 15 14   Temp: 97.7 F (36.5 C) 97.8 F (36.6 C)  97.7 F (36.5 C)  TempSrc: Oral Oral  Oral  SpO2:  98% 96% 100%  Weight:    70.1 kg  Height:    5\' 6"  (1.676 m)   No intake or output data in the 24 hours ending 08/22/23 1259 Filed Weights   08/21/23 2040 08/22/23 1217  Weight: 70.8 kg 70.1 kg    Examination:  General exam: Appears calm and comfortable  Respiratory system: Clear to auscultation. Respiratory effort normal. Cardiovascular system: Bradycardia.  No JVD, no murmurs rubs or gallops.  No  lower extremity edema.  Gastrointestinal system: Abdomen is nondistended, soft and nontender. No organomegaly or masses felt. Normal bowel sounds heard. Central nervous system: Alert and oriented. No focal neurological deficits. Extremities: Symmetric 5 x 5 power. Skin: No rashes, lesions or ulcers Psychiatry: Judgement and insight appear normal. Mood & affect appropriate.     Data Reviewed: I have personally  reviewed following labs and imaging studies  CBC: Recent Labs  Lab 08/20/23 1104 08/21/23 2050 08/22/23 0223  WBC 3.9* 3.5* 3.2*  HGB 14.4 14.8 12.7*  HCT 44.6 44.3 38.0*  MCV 89.2 89.3 89.8  PLT 200 182 159    Basic Metabolic Panel: Recent Labs  Lab 08/20/23 1104 08/21/23 2050 08/22/23 0223  NA 138 137 138  K 4.2 3.8 3.7  CL 103 100 106  CO2 26 24 24   GLUCOSE 111* 128* 107*  BUN 10 7 11   CREATININE 1.08 1.13 1.01  CALCIUM 9.4 9.3 9.0  MG  --  2.0  --   PHOS  --  4.3  --     GFR: Estimated Creatinine Clearance: 85.1 mL/min (by C-G formula based on SCr of 1.01 mg/dL).  Liver Function Tests: Recent Labs  Lab 08/20/23 1104 08/21/23 2050 08/22/23 0223  AST 635* 578* 504*  ALT 680* 646* 559*  ALKPHOS 148* 149* 136*  BILITOT 1.7* 1.2 1.2  PROT 7.0 7.3 6.3*  ALBUMIN 4.0 4.1 3.5    CBG: Recent Labs  Lab 08/21/23 2327 08/22/23 0823 08/22/23 1155  GLUCAP 116* 85 75     No results found for this or any previous visit (from the past 240 hours).       Radiology Studies: No results found.      Scheduled Meds:  enoxaparin (LOVENOX) injection  40 mg Subcutaneous Q24H   insulin  aspart  0-5 Units Subcutaneous QHS   insulin  aspart  0-6 Units Subcutaneous TID WC   irbesartan  75 mg Oral Daily   sodium chloride  flush  3 mL Intravenous Q12H   Continuous Infusions:   LOS: 0 days    Time spent: 40 minutes    Hilda Lovings, MD [facility]   To contact the attending provider between 7A-7P or the covering provider during after hours 7P-7A, please log into the web site [email or web address] and access using universal [place] password for that web site. If you do not have the password, please call the hospital operator.  08/22/2023, 12:59 PM

## 2023-08-23 DIAGNOSIS — R55 Syncope and collapse: Secondary | ICD-10-CM | POA: Diagnosis not present

## 2023-08-23 DIAGNOSIS — I10 Essential (primary) hypertension: Secondary | ICD-10-CM | POA: Diagnosis not present

## 2023-08-23 DIAGNOSIS — F419 Anxiety disorder, unspecified: Secondary | ICD-10-CM | POA: Diagnosis not present

## 2023-08-23 DIAGNOSIS — R7401 Elevation of levels of liver transaminase levels: Secondary | ICD-10-CM | POA: Diagnosis not present

## 2023-08-23 DIAGNOSIS — R001 Bradycardia, unspecified: Secondary | ICD-10-CM | POA: Diagnosis not present

## 2023-08-23 DIAGNOSIS — E119 Type 2 diabetes mellitus without complications: Secondary | ICD-10-CM | POA: Diagnosis not present

## 2023-08-23 LAB — COMPREHENSIVE METABOLIC PANEL WITH GFR
ALT: 504 U/L — ABNORMAL HIGH (ref 0–44)
AST: 470 U/L — ABNORMAL HIGH (ref 15–41)
Albumin: 3.1 g/dL — ABNORMAL LOW (ref 3.5–5.0)
Alkaline Phosphatase: 106 U/L (ref 38–126)
Anion gap: 10 (ref 5–15)
BUN: 9 mg/dL (ref 6–20)
CO2: 21 mmol/L — ABNORMAL LOW (ref 22–32)
Calcium: 8.9 mg/dL (ref 8.9–10.3)
Chloride: 108 mmol/L (ref 98–111)
Creatinine, Ser: 0.88 mg/dL (ref 0.61–1.24)
GFR, Estimated: >60 mL/min (ref >60)
Glucose, Bld: 73 mg/dL (ref 70–99)
Potassium: 3.9 mmol/L (ref 3.5–5.1)
Sodium: 139 mmol/L (ref 135–145)
Total Bilirubin: 1.3 mg/dL — ABNORMAL HIGH (ref 0.0–1.2)
Total Protein: 5.8 g/dL — ABNORMAL LOW (ref 6.5–8.1)

## 2023-08-23 LAB — CBC
HCT: 38.3 % — ABNORMAL LOW (ref 39.0–52.0)
Hemoglobin: 12.5 g/dL — ABNORMAL LOW (ref 13.0–17.0)
MCH: 29.1 pg (ref 26.0–34.0)
MCHC: 32.6 g/dL (ref 30.0–36.0)
MCV: 89.1 fL (ref 80.0–100.0)
Platelets: 154 10*3/uL (ref 150–400)
RBC: 4.3 MIL/uL (ref 4.22–5.81)
RDW: 13.6 % (ref 11.5–15.5)
WBC: 2.8 10*3/uL — ABNORMAL LOW (ref 4.0–10.5)
nRBC: 0 % (ref 0.0–0.2)

## 2023-08-23 LAB — MAGNESIUM: Magnesium: 1.8 mg/dL (ref 1.7–2.4)

## 2023-08-23 LAB — GLUCOSE, CAPILLARY
Glucose-Capillary: 77 mg/dL (ref 70–99)
Glucose-Capillary: 115 mg/dL — ABNORMAL HIGH (ref 70–99)

## 2023-08-23 NOTE — Progress Notes (Signed)
 EP Telemetry Review:   Patient remains in SB-SR overnight. No evidence of heart block or concerning arrhythmia while inpatient.     Plan: -have ordered 2 week cardiac monitor to be mailed to him -pending review, if no arrhythmia, he can follow up with his PCP as thus far, his heart rhythm has been normal -if concerning rhythm on monitor, EP will arrange visit in office    Creighton Doffing, NP-C, AGACNP-BC [facility] - Electrophysiology  08/23/2023, 2:09 PM

## 2023-08-23 NOTE — Discharge Summary (Signed)
 Physician Discharge Summary  Dennis Simon QIH:[id number] DOB: 02-Oct-1980 DOA: 08/21/2023  PCP: Genia Kettering, MD  Admit date: 08/21/2023 Discharge date: 08/23/2023  Time spent: 60 minutes  Recommendations for Outpatient Follow-up:  Follow-up with Plotnikov, Oakley Bellman, MD as scheduled on 08/24/2023. Follow-up with Dr. Lawana Pray in 3 to 4 weeks, office will call with appointment time.   Discharge Diagnoses:  Principal Problem:   Near syncope Active Problems:   Diabetes type 2, controlled (HCC)   HTN (hypertension)   Anxiety   Elevated transaminase level   Bradycardia   Discharge Condition: Stable and improved.  Diet recommendation: Heart healthy  Filed Weights   08/21/23 2040 08/22/23 1217 08/23/23 0546  Weight: 70.8 kg 70.1 kg 71 kg    History of present illness:  HPI per Dr. Cassell Cliche is a 43 y.o. male with medical history significant for hypertension, type 2 diabetes mellitus, migraines, anxiety, and elevated LFTs currently being worked up through [facility] who presents with complaints of low heart rate and episodes of lightheadedness and near syncope.    Patient reports experiencing recurrent episodes of lightheadedness with near syncope.  He checked his vitals during 1 of these episodes a couple days ago and noted his heart rate to be in the 30s on his blood pressure monitor.  Since then, he has been using a pulse oximeter to check his heart rate during the episodes and has found it to be in the 30s.  He usually recovers quickly after sitting down.  He has never experienced any chest discomfort associated with this.  He denies any recent illness, fever, chills, insect bite, or new medications.   ED Course: Upon arrival to the ED, patient is found to be afebrile and saturating well on room air with heart rate in the 50s and elevated blood pressure.  Labs are most notable for alkaline phosphatase 149, AST 578, ALT 646, normal troponin, normal BNP, potassium 3.8, and  magnesium 2.0.  EKG demonstrates sinus rhythm with rate 58.   Cardiology was consulted by the ED physician and medical admission was recommended.  Hospital Course:  #1 near syncope concern for symptomatic bradycardia -Patient presented with episodes of recurrent lightheadedness, near syncope noted at the time to be having heart rates in the 30s per his blood pressure monitor. - Patient also noted for several weeks prior to being diagnosed with celiac disease with episodes of nausea and vomiting. - TSH noted within normal limits. - Potassium at 3.8, magnesium at 2.0. - Patient maintained on telemetry during the hospitalization and noted to remain in sinus bradycardia with no evidence of high-grade AV block or concerning arrhythmia.  - 2 D echo obtained with a EF of 60 to 65%, NWMA, grade 1 diastolic dysfunction.  -Patient received a 1 L bolus of normal saline. -Orthostatics were obtained were negative however the dose was after patient was hydrated. -Patient had no further episodes of near syncope, dizziness or lightheadedness during the hospitalization. - Patient seen in consultation by cardiology and EP who reviewed EKG and chart and recommended 2-week monitor on discharge with close outpatient follow-up in clinic.   2.  Well-controlled diabetes mellitus type 2 -Hemoglobin A1c 5.3. - Diet controlled prior to admission. - CBG 85 this morning. - Patient maintained on SSI.   3.  Hypertension - Patient was placed on Avapro 75 mg daily, patient however stated he was told to discontinue Avapro due to his elevated LFTs and as such Avapro discontinued.   -  BP remained stable.  - Outpatient follow-up with PCP.   4.  Elevated LFTs -Being worked up in the outpatient setting. - Outpatient follow-up.  Procedures: 2D echo 08/22/2023  Consultations: Cardiology: Dr. Hayes Lipps 08/22/2023 Electrophysiology: Dr. Lawana Pray    Discharge Exam: Vitals:   08/23/23 0714 08/23/23 1030  BP: 120/77 (!)  140/82  Pulse: (!) 47 (!) 53  Resp: 18 (!) 28  Temp: 97.8 F (36.6 C) 97.8 F (36.6 C)  SpO2: 96% 98%    General: NAD. Cardiovascular: Bradycardia.  No murmurs rubs or gallops.  No JVD.  No lower extremity edema. Respiratory: Lungs clear to auscultation bilaterally.  No wheezes, no crackles, no rhonchi.  Fair air movement.  Speaking in full sentences.  Discharge Instructions   Discharge Instructions     Amb Referral to Nutrition and Diabetic Education   Complete by: As directed    Newly diagnosed with celiac disease, significant weight loss (30# in 1-2 months); [employer] employee   Diet general   Complete by: As directed    Gluten free diet.   Increase activity slowly   Complete by: As directed       Allergies as of 08/23/2023       Reactions   Hydrocodone Anaphylaxis   Gluten Meal Nausea And Vomiting   Empagliflozin  Nausea Only   Metformin Nausea Only   Tramadol Nausea Only        Medication List     STOP taking these medications    omeprazole  40 MG capsule Commonly known as: PRILOSEC   valsartan  80 MG tablet Commonly known as: DIOVAN        TAKE these medications    albuterol  108 (90 Base) MCG/ACT inhaler Commonly known as: VENTOLIN  HFA Inhale 2 puffs into the lungs every 4 (four) hours as needed for wheezing or shortness of breath.   ibuprofen  200 MG tablet Commonly known as: ADVIL  Take 400 mg by mouth every 6 (six) hours as needed for headache.   sucralfate  1 g tablet Commonly known as: CARAFATE  Take 1 tablet (1 g total) by mouth 2 (two) times daily on an empty stomach       Allergies  Allergen Reactions   Hydrocodone Anaphylaxis   Gluten Meal Nausea And Vomiting   Empagliflozin  Nausea Only   Metformin Nausea Only   Tramadol Nausea Only    Follow-up Information     Plotnikov, Oakley Bellman, MD. Schedule an appointment as soon as possible for a visit on 08/24/2023.   Specialty: Internal Medicine Why: f/u as scheduled. Contact  information: [address] [place] [address] [phone]         Lei Pump, MD. Schedule an appointment as soon as possible for a visit in 3 week(s).   Specialty: Cardiology Why: Office will call with appointment time. Contact information: [address] [place] [address] [phone]                  The results of significant diagnostics from this hospitalization (including imaging, microbiology, ancillary and laboratory) are listed below for reference.    Significant Diagnostic Studies: ECHOCARDIOGRAM COMPLETE Result Date: 08/22/2023    ECHOCARDIOGRAM REPORT   Patient Name:   Dennis Simon Date of Exam: 08/22/2023 Medical Rec #:  [id number]         Height:       66.0 in Accession #:    [id number]        Weight:       154.5 lb  Date of Birth:  02/14/1981         BSA:          1.792 m Patient Age:    42 years          BP:           125/86 mmHg Patient Gender: M                 HR:           45 bpm. Exam Location:  Inpatient Procedure: 2D Echo, Color Doppler and Cardiac Doppler (Both Spectral and Color            Flow Doppler were utilized during procedure). Indications:    R55 Syncope  History:        Patient has no prior history of Echocardiogram examinations.                 Risk Factors:Hypertension and Diabetes.  Sonographer:    Janette Medley RDCS Referring Phys: [id number] TIMOTHY S OPYD IMPRESSIONS  1. Left ventricular ejection fraction, by estimation, is 60 to 65%. The left ventricle has normal function. The left ventricle has no regional wall motion abnormalities. Left ventricular diastolic parameters are consistent with Grade I diastolic dysfunction (impaired relaxation).  2. Right ventricular systolic function is normal. The right ventricular size is normal.  3. The mitral valve is normal in structure. Trivial mitral valve regurgitation. No evidence of mitral stenosis.  4. The aortic valve is normal in structure. Aortic valve regurgitation is  trivial. No aortic stenosis is present.  5. The inferior vena cava is normal in size with greater than 50% respiratory variability, suggesting right atrial pressure of 3 mmHg. FINDINGS  Left Ventricle: Left ventricular ejection fraction, by estimation, is 60 to 65%. The left ventricle has normal function. The left ventricle has no regional wall motion abnormalities. The left ventricular internal cavity size was normal in size. There is  no left ventricular hypertrophy. Left ventricular diastolic parameters are consistent with Grade I diastolic dysfunction (impaired relaxation). Right Ventricle: The right ventricular size is normal. No increase in right ventricular wall thickness. Right ventricular systolic function is normal. Left Atrium: Left atrial size was normal in size. Right Atrium: Right atrial size was normal in size. Pericardium: There is no evidence of pericardial effusion. Mitral Valve: The mitral valve is normal in structure. Trivial mitral valve regurgitation. No evidence of mitral valve stenosis. Tricuspid Valve: The tricuspid valve is normal in structure. Tricuspid valve regurgitation is trivial. No evidence of tricuspid stenosis. Aortic Valve: The aortic valve is normal in structure. Aortic valve regurgitation is trivial. No aortic stenosis is present. Pulmonic Valve: The pulmonic valve was normal in structure. Pulmonic valve regurgitation is not visualized. No evidence of pulmonic stenosis. Aorta: The aortic root is normal in size and structure. Venous: The inferior vena cava is normal in size with greater than 50% respiratory variability, suggesting right atrial pressure of 3 mmHg. IAS/Shunts: No atrial level shunt detected by color flow Doppler.  LEFT VENTRICLE PLAX 2D LVIDd:         4.90 cm LVIDs:         2.40 cm LV PW:         0.70 cm LV IVS:        0.80 cm LVOT diam:     2.30 cm LV SV:         112 LV SV Index:   63 LVOT Area:     4.15 cm  RIGHT VENTRICLE             IVC RV S prime:     10.30  cm/s  IVC diam: 1.90 cm TAPSE (M-mode): 2.1 cm LEFT ATRIUM             Index        RIGHT ATRIUM           Index LA Vol (A2C):   38.2 ml 21.31 ml/m  RA Area:     16.00 cm LA Vol (A4C):   36.1 ml 20.14 ml/m  RA Volume:   38.20 ml  21.31 ml/m LA Biplane Vol: 37.7 ml 21.04 ml/m  AORTIC VALVE LVOT Vmax:   127.00 cm/s LVOT Vmean:  74.900 cm/s LVOT VTI:    0.270 m  AORTA Ao Root diam: 3.00 cm Ao Asc diam:  3.50 cm TRICUSPID VALVE TR Peak grad:   5.9 mmHg TR Vmax:        121.00 cm/s  SHUNTS Systemic VTI:  0.27 m Systemic Diam: 2.30 cm Dorothye Gathers MD Electronically signed by Dorothye Gathers MD Signature Date/Time: 08/22/2023/4:09:53 PM    Final     Microbiology: Recent Results (from the past 240 hours)  MRSA Next Gen by PCR, Nasal     Status: None   Collection Time: 08/22/23 12:14 PM   Specimen: Nasal Mucosa; Nasal Swab  Result Value Ref Range Status   MRSA by PCR Next Gen NOT DETECTED NOT DETECTED Final    Comment: (NOTE) The GeneXpert MRSA Assay (FDA approved for NASAL specimens only), is one component of a comprehensive MRSA colonization surveillance program. It is not intended to diagnose MRSA infection nor to guide or monitor treatment for MRSA infections. Test performance is not FDA approved in patients less than 82 years old. Performed at [facility], [address] [place], [place] [address]      Labs: Basic Metabolic Panel: Recent Labs  Lab 08/20/23 1104 08/21/23 2050 08/22/23 0223 08/23/23 0252  NA 138 137 138 139  K 4.2 3.8 3.7 3.9  CL 103 100 106 108  CO2 26 24 24  21*  GLUCOSE 111* 128* 107* 73  BUN 10 7 11 9   CREATININE 1.08 1.13 1.01 0.88  CALCIUM 9.4 9.3 9.0 8.9  MG  --  2.0  --  1.8  PHOS  --  4.3  --   --    Liver Function Tests: Recent Labs  Lab 08/20/23 1104 08/21/23 2050 08/22/23 0223 08/23/23 0252  AST 635* 578* 504* 470*  ALT 680* 646* 559* 504*  ALKPHOS 148* 149* 136* 106  BILITOT 1.7* 1.2 1.2 1.3*  PROT 7.0 7.3 6.3* 5.8*  ALBUMIN 4.0 4.1 3.5  3.1*   Recent Labs  Lab 08/21/23 2050  LIPASE 40   No results for input(s): "AMMONIA" in the last 168 hours. CBC: Recent Labs  Lab 08/20/23 1104 08/21/23 2050 08/22/23 0223 08/23/23 0252  WBC 3.9* 3.5* 3.2* 2.8*  HGB 14.4 14.8 12.7* 12.5*  HCT 44.6 44.3 38.0* 38.3*  MCV 89.2 89.3 89.8 89.1  PLT 200 182 159 154   Cardiac Enzymes: No results for input(s): "CKTOTAL", "CKMB", "CKMBINDEX", "TROPONINI" in the last 168 hours. BNP: BNP (last 3 results) Recent Labs    08/21/23 2050  BNP 53.5    ProBNP (last 3 results) No results for input(s): "PROBNP" in the last 8760 hours.  CBG: Recent Labs  Lab 08/22/23 1155 08/22/23 1655 08/22/23 2120 08/23/23 0556 08/23/23 1030  GLUCAP 75 71  120* 77 115*       Signed:  Hilda Lovings MD.  [facility] 08/23/2023, 3:29 PM

## 2023-08-23 NOTE — TOC Transition Note (Signed)
 Transition of Care [facility]) - Discharge Note   Patient Details  Name: Dennis Simon MRN: [id number] Date of Birth: 1980/08/23  [facility] of [facility]) CM/SW Contact:  Jennett Model, RN Phone Number: 08/23/2023, 10:52 AM   Clinical Narrative:    For possible dc today, has no needs.         Patient Goals and CMS Choice            Discharge Placement                       Discharge Plan and Services Additional resources added to the After Visit Summary for                                       Social Drivers of Health (SDOH) Interventions SDOH Screenings   Food Insecurity: No Food Insecurity (08/22/2023)  Housing: Low Risk  (08/22/2023)  Transportation Needs: No Transportation Needs (08/22/2023)  Utilities: Not At Risk (08/22/2023)  Depression (PHQ2-9): Low Risk  (11/08/2022)  Tobacco Use: Low Risk  (08/21/2023)     Readmission Risk Interventions     No data to display

## 2023-08-23 NOTE — Plan of Care (Signed)
  Problem: Education: Goal: Ability to describe self-care measures that may prevent or decrease complications (Diabetes Survival Skills Education) will improve Outcome: Adequate for Discharge Goal: Individualized Educational Video(s) Outcome: Adequate for Discharge   Problem: Coping: Goal: Ability to adjust to condition or change in health will improve Outcome: Adequate for Discharge   Problem: Fluid Volume: Goal: Ability to maintain a balanced intake and output will improve Outcome: Adequate for Discharge   Problem: Health Behavior/Discharge Planning: Goal: Ability to identify and utilize available resources and services will improve Outcome: Adequate for Discharge Goal: Ability to manage health-related needs will improve Outcome: Adequate for Discharge   Problem: Metabolic: Goal: Ability to maintain appropriate glucose levels will improve Outcome: Adequate for Discharge   Problem: Nutritional: Goal: Maintenance of adequate nutrition will improve Outcome: Adequate for Discharge Goal: Progress toward achieving an optimal weight will improve Outcome: Adequate for Discharge   Problem: Skin Integrity: Goal: Risk for impaired skin integrity will decrease Outcome: Adequate for Discharge   Problem: Tissue Perfusion: Goal: Adequacy of tissue perfusion will improve Outcome: Adequate for Discharge   Problem: Education: Goal: Knowledge of condition and prescribed therapy will improve Outcome: Adequate for Discharge   Problem: Cardiac: Goal: Will achieve and/or maintain adequate cardiac output Outcome: Adequate for Discharge   Problem: Physical Regulation: Goal: Complications related to the disease process, condition or treatment will be avoided or minimized Outcome: Adequate for Discharge   Problem: Education: Goal: Knowledge of General Education information will improve Description: Including pain rating scale, medication(s)/side effects and non-pharmacologic comfort  measures Outcome: Adequate for Discharge   Problem: Health Behavior/Discharge Planning: Goal: Ability to manage health-related needs will improve Outcome: Adequate for Discharge   Problem: Clinical Measurements: Goal: Ability to maintain clinical measurements within normal limits will improve Outcome: Adequate for Discharge Goal: Will remain free from infection Outcome: Adequate for Discharge Goal: Diagnostic test results will improve Outcome: Adequate for Discharge Goal: Respiratory complications will improve Outcome: Adequate for Discharge Goal: Cardiovascular complication will be avoided Outcome: Adequate for Discharge   Problem: Activity: Goal: Risk for activity intolerance will decrease Outcome: Adequate for Discharge   Problem: Nutrition: Goal: Adequate nutrition will be maintained Outcome: Adequate for Discharge   Problem: Coping: Goal: Level of anxiety will decrease Outcome: Adequate for Discharge   Problem: Elimination: Goal: Will not experience complications related to bowel motility Outcome: Adequate for Discharge Goal: Will not experience complications related to urinary retention Outcome: Adequate for Discharge   Problem: Pain Managment: Goal: General experience of comfort will improve and/or be controlled Outcome: Adequate for Discharge   Problem: Safety: Goal: Ability to remain free from injury will improve Outcome: Adequate for Discharge   Problem: Skin Integrity: Goal: Risk for impaired skin integrity will decrease Outcome: Adequate for Discharge

## 2023-08-23 NOTE — Progress Notes (Signed)
 Mobility Specialist Progress Note;   08/23/23 0950  Mobility  Activity Ambulated independently in hallway  Level of Assistance Modified independent, requires aide device or extra time  Assistive Device None  Distance Ambulated (ft) 1000 ft  Activity Response Tolerated well  Mobility Referral Yes  Mobility visit 1 Mobility  Mobility Specialist Start Time (ACUTE ONLY) 0950  Mobility Specialist Stop Time (ACUTE ONLY) 1000  Mobility Specialist Time Calculation (min) (ACUTE ONLY) 10 min   RN requesting pt to ambulate, pt agreeable. Required no physical assistance during ambulation, ModI. VSS throughout, HR in 60-70s throughout ambulation. No c/o when asked. Pt returned back to bed with all needs met. Family present.   Janit Meline Mobility Specialist Please contact via SecureChat or [employer] [phone]

## 2023-08-23 NOTE — Progress Notes (Addendum)
 Explained discharge instructions to patient. Reviewed follow up appointment and next medication administration times. Also reviewed education. Patient verbalized having an understanding for instructions given. All belongings are in the patient's possession. IV and telemetry were removed. CCMD was notified. No other needs  discharge.

## 2023-08-24 DIAGNOSIS — E119 Type 2 diabetes mellitus without complications: Secondary | ICD-10-CM | POA: Diagnosis not present

## 2023-08-24 DIAGNOSIS — R42 Dizziness and giddiness: Secondary | ICD-10-CM | POA: Diagnosis not present

## 2023-08-24 DIAGNOSIS — D72819 Decreased white blood cell count, unspecified: Secondary | ICD-10-CM | POA: Diagnosis not present

## 2023-08-24 DIAGNOSIS — S32020A Wedge compression fracture of second lumbar vertebra, initial encounter for closed fracture: Secondary | ICD-10-CM | POA: Diagnosis not present

## 2023-08-24 DIAGNOSIS — K759 Inflammatory liver disease, unspecified: Secondary | ICD-10-CM | POA: Diagnosis not present

## 2023-08-24 DIAGNOSIS — R001 Bradycardia, unspecified: Secondary | ICD-10-CM | POA: Diagnosis not present

## 2023-08-26 ENCOUNTER — Other Ambulatory Visit (HOSPITAL_COMMUNITY): Payer: Self-pay

## 2023-08-26 ENCOUNTER — Other Ambulatory Visit: Payer: Self-pay

## 2023-08-26 MED ORDER — FREESTYLE LIBRE 3 PLUS SENSOR MISC
1.0000 | 3 refills | Status: DC
  Filled 2023-08-26: qty 2, 28d supply, fill #0
  Filled 2023-10-10: qty 2, 28d supply, fill #1
  Filled 2024-01-19: qty 2, 28d supply, fill #2
  Filled 2024-01-31: qty 2, 30d supply, fill #3
  Filled 2024-01-31 – 2024-02-11 (×2): qty 2, 28d supply, fill #3

## 2023-08-26 MED ORDER — FREESTYLE LIBRE 3 READER DEVI
0 refills | Status: DC
  Filled 2023-08-26 – 2024-01-29 (×2): qty 1, 30d supply, fill #0

## 2023-08-27 ENCOUNTER — Encounter: Attending: Family Medicine | Admitting: Skilled Nursing Facility1

## 2023-08-27 ENCOUNTER — Encounter: Payer: Self-pay | Admitting: Skilled Nursing Facility1

## 2023-08-27 VITALS — Ht 66.0 in | Wt 162.0 lb

## 2023-08-27 DIAGNOSIS — K900 Celiac disease: Secondary | ICD-10-CM | POA: Insufficient documentation

## 2023-08-27 DIAGNOSIS — E119 Type 2 diabetes mellitus without complications: Secondary | ICD-10-CM | POA: Insufficient documentation

## 2023-08-27 NOTE — Progress Notes (Signed)
 Medical Nutrition Therapy  Appointment Start time:  3:26  Appointment End time:  4:15  Primary concerns today: Celiac diet  Referral diagnosis: [employer] employee   NUTRITION ASSESSMENT    Clinical Medical Hx: celiac disease  Medications: see list Labs: A1C 5.3 Notable Signs/Symptoms: fatigue   Lifestyle & Dietary Hx  Pt states when he was diagnosed with diabetes he made a lot of changes and continued to make those changes. Pt sates he thinks he has been having low blood sugar. Pt states he plans to by a CGM even though his insurance does not cover it.  Pt states he has lost about 30 pounds.  Pt states they are still trying to figure out what is going on with his high liver enzymes.   Estimated daily fluid intake:  oz Supplements:  Sleep:  Stress / self-care:  Current average weekly physical activity: walking daily   24-Hr Dietary Recall First Meal: 4 bacon + eggs + danimal yogurt + banan Snack:  Second Meal:  Snack: grapes Third Meal: chicken or fish with vegetable and grapes Snack: hershey and almond or smartee Beverages: very little sweet tea,    NUTRITION INTERVENTION  Nutrition education (E-1) on the following topics:  Celiac disease pathophysiology  What is malnutrition   Handouts Provided Include  Celiac free eating   Learning Style & Readiness for Change Teaching method utilized: Visual & Auditory  Demonstrated degree of understanding via: Teach Back  Barriers to learning/adherence to lifestyle change: none   Goals Established by Pt I will continue doing a fantastic job avoiding gluten in my diet  I will eat every 3 hours  I will increase resistance activities 2-3 days a week    MONITORING & EVALUATION Dietary intake, weekly physical activity  Next Steps  Patient is to follow up in 6 weeks.

## 2023-08-28 ENCOUNTER — Other Ambulatory Visit (HOSPITAL_COMMUNITY): Payer: Self-pay

## 2023-09-04 DIAGNOSIS — R7989 Other specified abnormal findings of blood chemistry: Secondary | ICD-10-CM | POA: Diagnosis not present

## 2023-09-04 DIAGNOSIS — K7400 Hepatic fibrosis, unspecified: Secondary | ICD-10-CM | POA: Diagnosis not present

## 2023-09-04 DIAGNOSIS — K7589 Other specified inflammatory liver diseases: Secondary | ICD-10-CM | POA: Diagnosis not present

## 2023-09-06 DIAGNOSIS — R945 Abnormal results of liver function studies: Secondary | ICD-10-CM | POA: Diagnosis not present

## 2023-09-06 DIAGNOSIS — J069 Acute upper respiratory infection, unspecified: Secondary | ICD-10-CM | POA: Diagnosis not present

## 2023-09-06 DIAGNOSIS — R768 Other specified abnormal immunological findings in serum: Secondary | ICD-10-CM | POA: Diagnosis not present

## 2023-09-11 DIAGNOSIS — R7989 Other specified abnormal findings of blood chemistry: Secondary | ICD-10-CM | POA: Diagnosis not present

## 2023-09-19 NOTE — Progress Notes (Signed)
 [facility] [phone]   [phone]  CONSULT NOTE  REFERRING PHYSICIAN: Dr. Maryrose Soja   REASON FOR CONSULTATION:  Leukopenia   HPI Dennis Simon is a 43 y.o. male with a past medical history significant for hypertension (no longer requiring anti-hypertensive), diabetes (diet controlled), GERD, migraines, newly diagnosed celiac's disease, candida esophagitis, and elevated LFTs is referred to the clinic for leukopenia.  In summary, the patient was referred to the clinic for leukopenia.  His leukopenia was identified during a hospitalization in May 2025.  He was hospitalized from 5/5-5/8 after presenting with near syncope, bradycardia, and lightheadedness. His WBC during this admission was trended down from 3.9-2.8.  He was referred to cardiology upon discharge but has not heard from them at this time for an appointment.  He was worked up with echo, expected to undergo Holter monitor testing, and received IV fluids.  His hemoglobin also trended down during his hospitalization which may be dilutional secondary to IV fluids.  Of note his CBC was recently rechecked on 09/11/2023 and his white blood cell count had improved to 3.7, hemoglobin 14.8, platelet count normal at 226K.  The patient then saw his PCP the following day who referred him to hematology for his leukopenia.   Of note, he was found to have B12 deficiency last year and was on B12 injections x2 followed by oral B12. His B12 was rechecked and was elevated; therefore, his B12 was discontinued over the last 6 months. He is thinking of restarting his B12 today. He was going to restart his 2500 mcg B12  In summary, the patient was in his normal state of health up until March 2025.  He had an extensive workup for significantly elevated LFTs, weight loss, dysphagia, and N/V and was ultimately diagnosed with celiac's disease.  He is now on a gluten-free diet and his LFTs continue to be elevated but have  been downtrending.  He is being followed by Dr. Kimble Pennant locally as well as Dr. Retia Castellani at [facility].  The history with his recent workup is complicated and summarized below.   He had an abrupt increase in his LFTs in early March 2025 with peak at AST 880s, ALT 1000s, ALKP 200s, normal bili.  His serologic workup included acute hepatitis panel, ASMA, AMA, ceruloplasmin, and iron studies. His TSH was also reported to be normal.   He had a CT of the abdomen and pelvis which did not show any focal liver abnormality, gallstones, or biliary dilation. He did have prominent mesenteric lymph nodes in the left mesentery measuring up to 0.8 cm are not enlarged by size criteria and may be reactive in etiology. He does not have any history of alcohol use, herbal supplements, new medications, or family history of liver disease.  He also had EGD in march 2025 which revealed celiac disease and candida esophagitis. His diabetes is reportedly well controlled with diet. His dysphagia has improved.   His ANA was positive with titer 1:640 with pattern of homogenous and speckled. EBV, Hep A and B were negative. HIV testing was negative.   He had a liver biopsy on 09/04/23, in summary, they felt his liver injury is related to his celiacs disease. He has a follow up appointment next week.   Overall, the patient has reporting fatigue.  He sometimes also has dizzy spells. He does not take any medicines on a regular basis.  The only medicine he was taking regularly was valsartan  which he has since  had discontinued due to the elevated LFTs.  He does not take any herbal supplements or over-the-counter medications.  He denies any frequent illness.  He denies any prior history of any autoimmune or inflammatory disorders.  He denies any current or prior alcohol use.  Besides B12, he has vitamin D  deficiency.  Since February 2025, he lost approximately 40 pounds.  Denies any history of bariatric surgery.  He denies any abnormal bleeding  or bruising.  He denies any history of HIV or hepatitis which were checked recently and were normal.  He denies any history of any tickborne illness.  He did have a tick bite 8 years ago and initially they were sending him for alpha gal prior to his celiac's diagnosis.  The patient is able to tolerate red meat fine without any unusual symptoms.  He denies any fever, chills, or night sweats.  He denies any palpable lymphadenopathy.  He denies any current signs and symptoms of infection including nasal congestion, sore throat, cough, skin infections, diarrhea, or dysuria.  He does not know his father's past medical history.  His mother passed away at the age of 66.  She had type 1 diabetes, stroke, and aneurysm.  The patient's brother has ulcerative colitis and diabetes.  The patient works in maintenance.  He is married.  He has 1 child.  He denies any alcohol, drug, or tobacco use.    HPI  Past Medical History:  Diagnosis Date   Asthma    Diabetes (HCC)    Headache     Past Surgical History:  Procedure Laterality Date   TONSILLECTOMY     tubes in ears      Family History  Problem Relation Age of Onset   Diabetes Mother    Aneurysm Mother    Cerebral aneurysm Mother 58       Died suddenly   Other Father        died in house fire   Cancer Maternal Grandmother        unsure of type    Social History Social History   Tobacco Use   Smoking status: Never   Smokeless tobacco: Never  Substance Use Topics   Alcohol use: No   Drug use: No    Allergies  Allergen Reactions   Hydrocodone Anaphylaxis   Gluten Meal Nausea And Vomiting   Empagliflozin  Nausea Only   Metformin Nausea Only   Tramadol Nausea Only    Current Outpatient Medications  Medication Sig Dispense Refill   ibuprofen  (ADVIL ) 200 MG tablet Take 400 mg by mouth every 6 (six) hours as needed for headache.     vitamin B-12 (CYANOCOBALAMIN ) 50 MCG tablet Take 2,500 mcg by mouth daily.     albuterol  (VENTOLIN   HFA) 108 (90 Base) MCG/ACT inhaler Inhale 2 puffs into the lungs every 4 (four) hours as needed for wheezing or shortness of breath. (Patient not taking: Reported on 09/21/2023) 6.7 g 3   Continuous Glucose Receiver (FREESTYLE LIBRE 3 READER) DEVI Use with sensors as directed (Patient not taking: Reported on 09/21/2023) 1 each 0   Continuous Glucose Sensor (FREESTYLE LIBRE 3 PLUS SENSOR) MISC Inject 1 sensor into the skin every 14 (fourteen) days. (Patient not taking: Reported on 09/21/2023) 2 each 3   sucralfate  (CARAFATE ) 1 g tablet Take 1 tablet (1 g total) by mouth 2 (two) times daily on an empty stomach (Patient not taking: Reported on 08/21/2023) 60 tablet 2   No current facility-administered medications for this visit.  REVIEW OF SYSTEMS:   Review of Systems  Constitutional: Positive for fatigue and weight loss. Negative for appetite change, chills, and fever.  HENT: Negative for mouth sores, nosebleeds, sore throat and trouble swallowing.   Eyes: Negative for eye problems and icterus.  Respiratory: Negative for cough, hemoptysis, shortness of breath and wheezing.   Cardiovascular: Negative for chest pain and leg swelling.  Gastrointestinal: Negative for constipation, diarrhea, nausea and vomiting.  Genitourinary: Negative for bladder incontinence, difficulty urinating, dysuria, frequency and hematuria.   Musculoskeletal: Negative for back pain, gait problem, neck pain and neck stiffness.  Skin: Negative for itching and rash.  Neurological: Positive for occasional lightheaded or dizziness. Negative for  extremity weakness, gait problem, headaches, and seizures.  Hematological: Negative for adenopathy. Does not bruise/bleed easily.  Psychiatric/Behavioral: Negative for confusion, depression and sleep disturbance. The patient is not nervous/anxious.     PHYSICAL EXAMINATION:  Blood pressure 118/64, pulse 63, temperature 98.2 F (36.8 C), temperature source Temporal, resp. rate 16, weight 166  lb (75.3 kg), SpO2 100%.  ECOG PERFORMANCE STATUS: 0-1  Physical Exam  Constitutional: Oriented to person, place, and time and thin appearing male and in no distress.  HENT:  Head: Normocephalic and atraumatic.  Mouth/Throat: Oropharynx is clear and moist. No oropharyngeal exudate.  Eyes: Conjunctivae are normal. Right eye exhibits no discharge. Left eye exhibits no discharge. No scleral icterus.  Neck: Normal range of motion. Neck supple.  Cardiovascular: Normal rate, regular rhythm, normal heart sounds and intact distal pulses.   Pulmonary/Chest: Effort normal and breath sounds normal. No respiratory distress. No wheezes. No rales.  Abdominal: Soft. Bowel sounds are normal. Exhibits no distension and no mass. There is no tenderness.  Musculoskeletal: Normal range of motion. Exhibits no edema.  Lymphadenopathy:    No cervical adenopathy.  Neurological: Alert and oriented to person, place, and time. Exhibits normal muscle tone. Gait normal. Coordination normal.  Skin: Skin is warm and dry. No rash noted. Not diaphoretic. No erythema. No pallor.  Psychiatric: Mood, memory and judgment normal.  Vitals reviewed.  LABORATORY DATA: Lab Results  Component Value Date   WBC 4.5 09/21/2023   HGB 13.8 09/21/2023   HCT 40.9 09/21/2023   MCV 86.8 09/21/2023   PLT 179 09/21/2023      Chemistry      Component Value Date/Time   NA 140 09/21/2023 1217   K 4.3 09/21/2023 1217   CL 105 09/21/2023 1217   CO2 29 09/21/2023 1217   BUN 13 09/21/2023 1217   CREATININE 1.12 09/21/2023 1217      Component Value Date/Time   CALCIUM 9.0 09/21/2023 1217   ALKPHOS 87 09/21/2023 1217   AST 198 (HH) 09/21/2023 1217   ALT 241 (H) 09/21/2023 1217   BILITOT 0.6 09/21/2023 1217       RADIOGRAPHIC STUDIES: No results found.   ASSESSMENT: This very pleasant 43 year old Caucasian male referred to the clinic for leukopenia and neutropenia   The patient was seen with Dr. Rosaline Coma today.    After  review of the labs, review of the records, and discussion with the patient the patients findings are most consistent with leukopenia, possibly secondary to nutritional deficiency from celiac's disease.    The differential for neutropenia includes nutritional deficiency, inflammatory disorder, medication side effect, infectious etiology, or congenital condition (such as benign ethnic neutropenia). The patient is not taking any medications known to cause neutropenia. Workup for this condition includes viral serologies (HIV, Hep B and Hep C),  vitamin  b12/folate, and inflammatory markers with ESR and CRP.  He was tested for HIV and hepatitis which was negative.      #Leukopenia,Unclear Etiology  --The patient was seen with Dr. Rosaline Coma --Workup for this condition includes:  --repeat CBC and CMP today --infectious serology testing with Hep B, Hep A were drawn May 2025 and was non-reactive.  --I do not see hepatitis C testing but this may have been performed at [facility].   --Nutritional evaluation with Vitamin b12, methylmalonic acid, and folate ordered for today --inflammatory workup with ESR and CRP, which are expected to be elevated in the setting of celiacs and liver injury --I will call him with the results early next week once I have everything resulted.  --If B12 low, the patient will take B12 supplements and close monitoring with his PCP   The patient voices understanding of current disease status and treatment options and is in agreement with the current care plan.  All questions were answered. The patient knows to call the clinic with any problems, questions or concerns. We can certainly see the patient much sooner if necessary.  Thank you so much for allowing me to participate in the care of Alvis Ba. I will continue to follow up the patient with you and assist in his care.  Disclaimer: This note was dictated with voice recognition software. Similar sounding words can  inadvertently be transcribed and may not be corrected upon review.   Rogerio Clay IV September 30, 2023, 1:42 PM   I have read the above note and personally examined the patient. I agree with the assessment and plan as noted above.  Briefly Mr. 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  The patient has also had some recent elevations in his LFTs and was found to have celiac disease.  He has made alterations in his diet since that time several weeks ago..  They will conduct a full workup to include hepatitis C serologies as well as full nutritional workup with vitamin B12 and folate.  We will also order ESR and CRP.  At this time I do suspect that his neutropenia and leukopenia were secondary to inflammation and poor nutritional absorption due to celiac disease.  Now that he is making dietary changes I strongly suspect that his white blood cell count will improve.  The patient voiced understanding of our findings and plan moving forward.  In the event his blood counts have normalized would recommend returning to clinic on an as-needed basis.   Rogerio Clay, MD Department of Hematology/Oncology [facility] at [facility] Phone: [phone] Pager: [phone] Email: [email or web address]

## 2023-09-20 ENCOUNTER — Other Ambulatory Visit: Payer: Self-pay | Admitting: Physician Assistant

## 2023-09-20 DIAGNOSIS — R1310 Dysphagia, unspecified: Secondary | ICD-10-CM | POA: Diagnosis not present

## 2023-09-20 DIAGNOSIS — D72819 Decreased white blood cell count, unspecified: Secondary | ICD-10-CM

## 2023-09-20 DIAGNOSIS — D649 Anemia, unspecified: Secondary | ICD-10-CM

## 2023-09-20 DIAGNOSIS — K900 Celiac disease: Secondary | ICD-10-CM | POA: Diagnosis not present

## 2023-09-20 DIAGNOSIS — R748 Abnormal levels of other serum enzymes: Secondary | ICD-10-CM | POA: Diagnosis not present

## 2023-09-20 DIAGNOSIS — R001 Bradycardia, unspecified: Secondary | ICD-10-CM | POA: Diagnosis not present

## 2023-09-21 ENCOUNTER — Inpatient Hospital Stay: Attending: Physician Assistant | Admitting: Physician Assistant

## 2023-09-21 ENCOUNTER — Inpatient Hospital Stay

## 2023-09-21 VITALS — BP 118/64 | HR 63 | Temp 98.2°F | Resp 16 | Wt 166.0 lb

## 2023-09-21 DIAGNOSIS — R1310 Dysphagia, unspecified: Secondary | ICD-10-CM | POA: Insufficient documentation

## 2023-09-21 DIAGNOSIS — D72819 Decreased white blood cell count, unspecified: Secondary | ICD-10-CM | POA: Diagnosis not present

## 2023-09-21 DIAGNOSIS — Z809 Family history of malignant neoplasm, unspecified: Secondary | ICD-10-CM | POA: Diagnosis not present

## 2023-09-21 DIAGNOSIS — E109 Type 1 diabetes mellitus without complications: Secondary | ICD-10-CM | POA: Insufficient documentation

## 2023-09-21 DIAGNOSIS — I10 Essential (primary) hypertension: Secondary | ICD-10-CM | POA: Diagnosis not present

## 2023-09-21 DIAGNOSIS — R7989 Other specified abnormal findings of blood chemistry: Secondary | ICD-10-CM | POA: Insufficient documentation

## 2023-09-21 DIAGNOSIS — Z885 Allergy status to narcotic agent status: Secondary | ICD-10-CM | POA: Diagnosis not present

## 2023-09-21 DIAGNOSIS — Z9089 Acquired absence of other organs: Secondary | ICD-10-CM | POA: Diagnosis not present

## 2023-09-21 DIAGNOSIS — R55 Syncope and collapse: Secondary | ICD-10-CM | POA: Insufficient documentation

## 2023-09-21 DIAGNOSIS — E538 Deficiency of other specified B group vitamins: Secondary | ICD-10-CM | POA: Diagnosis not present

## 2023-09-21 DIAGNOSIS — J45909 Unspecified asthma, uncomplicated: Secondary | ICD-10-CM

## 2023-09-21 DIAGNOSIS — R5383 Other fatigue: Secondary | ICD-10-CM | POA: Insufficient documentation

## 2023-09-21 DIAGNOSIS — E119 Type 2 diabetes mellitus without complications: Secondary | ICD-10-CM | POA: Diagnosis not present

## 2023-09-21 DIAGNOSIS — Z79899 Other long term (current) drug therapy: Secondary | ICD-10-CM | POA: Insufficient documentation

## 2023-09-21 DIAGNOSIS — K219 Gastro-esophageal reflux disease without esophagitis: Secondary | ICD-10-CM

## 2023-09-21 DIAGNOSIS — D649 Anemia, unspecified: Secondary | ICD-10-CM

## 2023-09-21 DIAGNOSIS — E559 Vitamin D deficiency, unspecified: Secondary | ICD-10-CM | POA: Insufficient documentation

## 2023-09-21 DIAGNOSIS — K900 Celiac disease: Secondary | ICD-10-CM | POA: Insufficient documentation

## 2023-09-21 DIAGNOSIS — R634 Abnormal weight loss: Secondary | ICD-10-CM | POA: Diagnosis not present

## 2023-09-21 DIAGNOSIS — Z8249 Family history of ischemic heart disease and other diseases of the circulatory system: Secondary | ICD-10-CM | POA: Insufficient documentation

## 2023-09-21 DIAGNOSIS — R001 Bradycardia, unspecified: Secondary | ICD-10-CM | POA: Diagnosis not present

## 2023-09-21 DIAGNOSIS — Z833 Family history of diabetes mellitus: Secondary | ICD-10-CM | POA: Insufficient documentation

## 2023-09-21 DIAGNOSIS — R42 Dizziness and giddiness: Secondary | ICD-10-CM | POA: Diagnosis not present

## 2023-09-21 LAB — CMP (CANCER CENTER ONLY)
ALT: 241 U/L — ABNORMAL HIGH (ref 0–44)
AST: 198 U/L (ref 15–41)
Albumin: 4.2 g/dL (ref 3.5–5.0)
Alkaline Phosphatase: 87 U/L (ref 38–126)
Anion gap: 6 (ref 5–15)
BUN: 13 mg/dL (ref 6–20)
CO2: 29 mmol/L (ref 22–32)
Calcium: 9 mg/dL (ref 8.9–10.3)
Chloride: 105 mmol/L (ref 98–111)
Creatinine: 1.12 mg/dL (ref 0.61–1.24)
GFR, Estimated: >60 mL/min (ref >60)
Glucose, Bld: 108 mg/dL — ABNORMAL HIGH (ref 70–99)
Potassium: 4.3 mmol/L (ref 3.5–5.1)
Sodium: 140 mmol/L (ref 135–145)
Total Bilirubin: 0.6 mg/dL (ref 0.0–1.2)
Total Protein: 6.9 g/dL (ref 6.5–8.1)

## 2023-09-21 LAB — RETIC PANEL
Immature Retic Fract: 4.4 % (ref 2.3–15.9)
RBC.: 4.65 MIL/uL (ref 4.22–5.81)
Retic Count, Absolute: 46.5 10*3/uL (ref 19.0–186.0)
Retic Ct Pct: 1 % (ref 0.4–3.1)
Reticulocyte Hemoglobin: 34.3 pg (ref >27.9)

## 2023-09-21 LAB — CBC WITH DIFFERENTIAL (CANCER CENTER ONLY)
Abs Immature Granulocytes: 0.01 10*3/uL (ref 0.00–0.07)
Basophils Absolute: 0 10*3/uL (ref 0.0–0.1)
Basophils Relative: 1 %
Eosinophils Absolute: 0.1 10*3/uL (ref 0.0–0.5)
Eosinophils Relative: 3 %
HCT: 40.9 % (ref 39.0–52.0)
Hemoglobin: 13.8 g/dL (ref 13.0–17.0)
Immature Granulocytes: 0 %
Lymphocytes Relative: 26 %
Lymphs Abs: 1.2 10*3/uL (ref 0.7–4.0)
MCH: 29.3 pg (ref 26.0–34.0)
MCHC: 33.7 g/dL (ref 30.0–36.0)
MCV: 86.8 fL (ref 80.0–100.0)
Monocytes Absolute: 0.4 10*3/uL (ref 0.1–1.0)
Monocytes Relative: 9 %
Neutro Abs: 2.7 10*3/uL (ref 1.7–7.7)
Neutrophils Relative %: 61 %
Platelet Count: 179 10*3/uL (ref 150–400)
RBC: 4.71 MIL/uL (ref 4.22–5.81)
RDW: 13 % (ref 11.5–15.5)
WBC Count: 4.5 10*3/uL (ref 4.0–10.5)
nRBC: 0 % (ref 0.0–0.2)

## 2023-09-21 LAB — IRON AND IRON BINDING CAPACITY (CC-WL,HP ONLY)
Iron: 86 ug/dL (ref 45–182)
Saturation Ratios: 20 % (ref 17.9–39.5)
TIBC: 437 ug/dL (ref 250–450)
UIBC: 351 ug/dL (ref 117–376)

## 2023-09-21 LAB — SEDIMENTATION RATE: Sed Rate: 2 mm/h (ref 0–16)

## 2023-09-21 LAB — FOLATE: Folate: 9.3 ng/mL (ref >5.9)

## 2023-09-21 LAB — C-REACTIVE PROTEIN: CRP: 0.5 mg/dL (ref <1.0)

## 2023-09-21 LAB — FERRITIN: Ferritin: 88 ng/mL (ref 24–336)

## 2023-09-21 LAB — VITAMIN B12: Vitamin B-12: 1277 pg/mL — ABNORMAL HIGH (ref 180–914)

## 2023-09-21 NOTE — Progress Notes (Signed)
 Faxed CBC and CMP results to Dr. Karlis Overland at [facility] with confirmation at [phone].

## 2023-09-24 ENCOUNTER — Telehealth: Payer: Self-pay | Admitting: Physician Assistant

## 2023-09-24 LAB — METHYLMALONIC ACID, SERUM: Methylmalonic Acid, Quantitative: 210 nmol/L (ref 0–378)

## 2023-09-24 NOTE — Telephone Encounter (Signed)
 I called the patient to review his labs. His CBC was WNL. I sent a copy of his CMP to his gastroenterologist to avoid duplicate labs as they were going to repeat his CMP today though [facility]. The patient states that the ALT and AST are trending down compared to his most recent set of labs through [facility] from 09/11/23. His B12 was elevated. Therefore, he is going to avoid resuming a dedicated B12 supplement at this time. He will follow with his PCP for close monitoring for potential recurrent nutritional deficiency on future labs given his recent celiacs. His inflammatory markers were normal too. No follow up needed. Of course, would be happy to see him back on as needed basis if any hematologic concerns in the future.

## 2023-09-26 DIAGNOSIS — R748 Abnormal levels of other serum enzymes: Secondary | ICD-10-CM | POA: Diagnosis not present

## 2023-09-26 DIAGNOSIS — K900 Celiac disease: Secondary | ICD-10-CM | POA: Diagnosis not present

## 2023-09-26 DIAGNOSIS — Z1331 Encounter for screening for depression: Secondary | ICD-10-CM | POA: Diagnosis not present

## 2023-09-27 ENCOUNTER — Ambulatory Visit: Attending: Pulmonary Disease

## 2023-09-27 ENCOUNTER — Telehealth: Payer: Self-pay | Admitting: Pulmonary Disease

## 2023-09-27 DIAGNOSIS — R5383 Other fatigue: Secondary | ICD-10-CM

## 2023-09-27 DIAGNOSIS — R42 Dizziness and giddiness: Secondary | ICD-10-CM

## 2023-09-27 NOTE — Telephone Encounter (Signed)
 Contacted by patient, who is a Producer, television/film/video, by work email requesting an additional cardiac monitor.  Documentation from patient as below:    Good morning after having another doctors appointment this morning, he asked about me wearing the Holter monitor again. I did return the previous one. I didn't wear it due to the [facility] procedure and I wasn't sure how that would affect it. Is it something that you can help me with or point me in the right direction thank you. Sorry just giving more information as to why he stated that . I'm still having the fatigue and dizziness so he mentioned the monitor. Thank you for your time and help.    Patient had prior cardiac monitor orders.  It appears he was unable to complete this due to other medical work up.  Will re-order Zio for 7 days for review given ongoing symptoms.  Pending Zio review, may need additional follow up appt.      Creighton Doffing, NP-C, AGACNP-BC [facility] - Electrophysiology  09/27/2023, 7:15 AM

## 2023-09-27 NOTE — Progress Notes (Unsigned)
 Enrolled for [employer] to mail a ZIO XT long term holter monitor to the patients address on file.   Dr. Elberta Fortis to read.

## 2023-09-28 DIAGNOSIS — R1084 Generalized abdominal pain: Secondary | ICD-10-CM | POA: Diagnosis not present

## 2023-09-28 DIAGNOSIS — R42 Dizziness and giddiness: Secondary | ICD-10-CM | POA: Diagnosis not present

## 2023-09-28 DIAGNOSIS — K900 Celiac disease: Secondary | ICD-10-CM | POA: Diagnosis not present

## 2023-09-28 DIAGNOSIS — R748 Abnormal levels of other serum enzymes: Secondary | ICD-10-CM | POA: Diagnosis not present

## 2023-10-09 ENCOUNTER — Other Ambulatory Visit (HOSPITAL_BASED_OUTPATIENT_CLINIC_OR_DEPARTMENT_OTHER): Payer: Self-pay | Admitting: Family Medicine

## 2023-10-09 DIAGNOSIS — S32020A Wedge compression fracture of second lumbar vertebra, initial encounter for closed fracture: Secondary | ICD-10-CM

## 2023-10-11 ENCOUNTER — Ambulatory Visit: Admitting: Skilled Nursing Facility1

## 2023-10-11 ENCOUNTER — Other Ambulatory Visit: Payer: Self-pay

## 2023-10-11 ENCOUNTER — Other Ambulatory Visit (HOSPITAL_COMMUNITY): Payer: Self-pay

## 2023-10-12 DIAGNOSIS — R5383 Other fatigue: Secondary | ICD-10-CM

## 2023-10-12 DIAGNOSIS — R42 Dizziness and giddiness: Secondary | ICD-10-CM | POA: Diagnosis not present

## 2023-10-15 ENCOUNTER — Encounter (INDEPENDENT_AMBULATORY_CARE_PROVIDER_SITE_OTHER): Payer: Self-pay

## 2023-10-16 ENCOUNTER — Ambulatory Visit: Payer: Self-pay | Admitting: Pulmonary Disease

## 2023-11-01 ENCOUNTER — Other Ambulatory Visit (HOSPITAL_COMMUNITY): Payer: Self-pay

## 2023-11-01 MED ORDER — ONETOUCH VERIO VI STRP
ORAL_STRIP | 3 refills | Status: AC
  Filled 2023-11-01: qty 100, 90d supply, fill #0

## 2023-11-01 MED ORDER — LANCETS ULTRA THIN MISC
1.0000 | Freq: Every day | 3 refills | Status: AC
  Filled 2023-11-01: qty 100, 90d supply, fill #0

## 2023-11-01 MED ORDER — FREESTYLE LITE W/DEVICE KIT
PACK | 0 refills | Status: AC
  Filled 2023-11-01: qty 1, 365d supply, fill #0

## 2023-11-05 DIAGNOSIS — E1169 Type 2 diabetes mellitus with other specified complication: Secondary | ICD-10-CM | POA: Diagnosis not present

## 2023-11-13 ENCOUNTER — Other Ambulatory Visit (HOSPITAL_COMMUNITY): Payer: Self-pay

## 2023-11-14 DIAGNOSIS — R42 Dizziness and giddiness: Secondary | ICD-10-CM | POA: Diagnosis not present

## 2023-11-14 DIAGNOSIS — E119 Type 2 diabetes mellitus without complications: Secondary | ICD-10-CM | POA: Diagnosis not present

## 2023-11-14 DIAGNOSIS — R945 Abnormal results of liver function studies: Secondary | ICD-10-CM | POA: Diagnosis not present

## 2023-11-14 DIAGNOSIS — R399 Unspecified symptoms and signs involving the genitourinary system: Secondary | ICD-10-CM | POA: Diagnosis not present

## 2023-12-07 DIAGNOSIS — Z0000 Encounter for general adult medical examination without abnormal findings: Secondary | ICD-10-CM | POA: Diagnosis not present

## 2023-12-07 DIAGNOSIS — E1169 Type 2 diabetes mellitus with other specified complication: Secondary | ICD-10-CM | POA: Diagnosis not present

## 2023-12-25 ENCOUNTER — Institutional Professional Consult (permissible substitution) (INDEPENDENT_AMBULATORY_CARE_PROVIDER_SITE_OTHER): Admitting: Physician Assistant

## 2023-12-25 ENCOUNTER — Ambulatory Visit (INDEPENDENT_AMBULATORY_CARE_PROVIDER_SITE_OTHER): Admitting: Audiology

## 2024-01-03 NOTE — Progress Notes (Deleted)
 CARDIOLOGY CONSULT NOTE       Patient ID: Dennis Simon MRN: [id number] DOB/AGE: 1980/09/30 43 y.o.  Admit date: (Not on file) Referring Physician: *** Primary Physician: Kip Righter, MD Primary Cardiologist: *** Reason for Consultation: ***  Active Problems:   * No active hospital problems. *   HPI:  ***  ROS All other systems reviewed and negative except as noted above  Past Medical History:  Diagnosis Date   Asthma    Diabetes (HCC)    Headache     Family History  Problem Relation Age of Onset   Diabetes Mother    Aneurysm Mother    Cerebral aneurysm Mother 8       Died suddenly   Other Father        died in house fire   Cancer Maternal Grandmother        unsure of type    Social History   Socioeconomic History   Marital status: Legally Separated    Spouse name: Not on file   Number of children: 1   Years of education: 12   Highest education level: High school graduate  Occupational History   Occupation: facility maintenance tech  Tobacco Use   Smoking status: Never   Smokeless tobacco: Never  Substance and Sexual Activity   Alcohol use: No   Drug use: No   Sexual activity: Not on file  Other Topics Concern   Not on file  Social History Narrative   Lives at home alone.   Right-handed.   3-4 cups caffeine per day.   Social Drivers of Corporate investment banker Strain: Not on file  Food Insecurity: No Food Insecurity (08/22/2023)   Hunger Vital Sign    Worried About Running Out of Food in the Last Year: Never true    Ran Out of Food in the Last Year: Never true  Transportation Needs: No Transportation Needs (08/22/2023)   PRAPARE - Administrator, Civil Service (Medical): No    Lack of Transportation (Non-Medical): No  Physical Activity: Not on file  Stress: Not on file  Social Connections: Not on file  Intimate Partner Violence: Not At Risk (08/22/2023)   Humiliation, Afraid, Rape, and Kick questionnaire    Fear of  Current or Ex-Partner: No    Emotionally Abused: No    Physically Abused: No    Sexually Abused: No    Past Surgical History:  Procedure Laterality Date   TONSILLECTOMY     tubes in ears        Current Outpatient Medications:    albuterol  (VENTOLIN  HFA) 108 (90 Base) MCG/ACT inhaler, Inhale 2 puffs into the lungs every 4 (four) hours as needed for wheezing or shortness of breath. (Patient not taking: Reported on 09/21/2023), Disp: 6.7 g, Rfl: 3   Blood Glucose Monitoring Suppl (FREESTYLE LITE) w/Device KIT, Use as directed once a day., Disp: 1 kit, Rfl: 0   Continuous Glucose Receiver (FREESTYLE LIBRE 3 READER) DEVI, Use with sensors as directed (Patient not taking: Reported on 09/21/2023), Disp: 1 each, Rfl: 0   Continuous Glucose Sensor (FREESTYLE LIBRE 3 PLUS SENSOR) MISC, Inject 1 sensor into the skin every 14 (fourteen) days. (Patient not taking: Reported on 09/21/2023), Disp: 2 each, Rfl: 3   glucose blood (ONETOUCH VERIO) test strip, Use once daily., Disp: 100 strip, Rfl: 3   ibuprofen  (ADVIL ) 200 MG tablet, Take 400 mg by mouth every 6 (six) hours as needed for headache., Disp: ,  Rfl:    Lancets Ultra Thin MISC, Use once daily., Disp: 100 each, Rfl: 3   sucralfate  (CARAFATE ) 1 g tablet, Take 1 tablet (1 g total) by mouth 2 (two) times daily on an empty stomach (Patient not taking: Reported on 08/21/2023), Disp: 60 tablet, Rfl: 2   vitamin B-12 (CYANOCOBALAMIN ) 50 MCG tablet, Take 2,500 mcg by mouth daily., Disp: , Rfl:     Physical Exam: There were no vitals taken for this visit. *** HELP TEXT ***  This SmartLink requires parameters. Parameters are variables that are added to the [facility] name to request specific information. The parameter for .curwt is the number of encounters to display readings from.  For example: .curwt[4  In this example, the SmartLink displays readings from the last four encounters.    {physical [id number]}  Labs:   Lab Results  Component Value Date    WBC 4.5 09/21/2023   HGB 13.8 09/21/2023   HCT 40.9 09/21/2023   MCV 86.8 09/21/2023   PLT 179 09/21/2023   No results for input(s): NA, K, CL, CO2, BUN, CREATININE, CALCIUM, PROT, BILITOT, ALKPHOS, ALT, AST, GLUCOSE in the last 168 hours.  Invalid input(s): LABALBU No results found for: CKTOTAL, CKMB, CKMBINDEX, TROPONINI  Lab Results  Component Value Date   CHOL 180 10/03/2022   Lab Results  Component Value Date   HDL 36.50 (L) 10/03/2022   Lab Results  Component Value Date   LDLCALC 119 (H) 10/03/2022   Lab Results  Component Value Date   TRIG 122.0 10/03/2022   Lab Results  Component Value Date   CHOLHDL 5 10/03/2022   No results found for: LDLDIRECT    Radiology: No results found.  EKG: ***   ASSESSMENT AND PLAN:    Signed: Maude Emmer 01/03/2024, 3:21 PM

## 2024-01-04 DIAGNOSIS — R748 Abnormal levels of other serum enzymes: Secondary | ICD-10-CM | POA: Diagnosis not present

## 2024-01-07 DIAGNOSIS — K900 Celiac disease: Secondary | ICD-10-CM | POA: Diagnosis not present

## 2024-01-07 DIAGNOSIS — R1084 Generalized abdominal pain: Secondary | ICD-10-CM | POA: Diagnosis not present

## 2024-01-07 DIAGNOSIS — K30 Functional dyspepsia: Secondary | ICD-10-CM | POA: Diagnosis not present

## 2024-01-07 DIAGNOSIS — R17 Unspecified jaundice: Secondary | ICD-10-CM | POA: Diagnosis not present

## 2024-01-07 DIAGNOSIS — R7989 Other specified abnormal findings of blood chemistry: Secondary | ICD-10-CM | POA: Diagnosis not present

## 2024-01-07 NOTE — Progress Notes (Signed)
 Referring Provider Elsie Simon [address] [place] [address] Phone: [phone] Fax: [phone]   Primary Care Provider Kip Beverley RAMAN, MD [address] [place] [address]    Patient Profile: Dennis Simon is a 43 y.o. male with HTN, DM2, GERD, migraines, and  celiac disease, and hx of candida esophagitis, who presents to the [facility] for f/u of abnml liver tests.   History of Present Illness: Dennis Simon has been following with [facility] for elevated liver tests, which were notably normal in Feb 2025 and then acutely increased starting in march (trend as below) with peak at AST 880s, ALT 1000s, ALKP 200s, normal bili. Serologic work-up was overall unrevealing (including acute hepatitis panel, ASMA, AMA, ceruloplasmin, iron studies), with exception of +TTG IgA and ANA 1:640. EGD 06/2023 (done for dysphagia, n/v, early satiety and reflux) with endoscopic / path findings consistent with celiac disease and candida esophagitis. He was Rx'd fluconazole  x10d, but ultimately did not take this due to elevated liver enzymes. CT A/P with no focal liver abnormality, no gallstones nor biliary dilatation.  In regards to risk factors for liver disease, no prior high risk sexual practices. No ETOH use. No IVDU. No blood transfusions. No tattoos. He does have some risk factors for metabolic syndrome (HTN, DM2). No family history of liver disease. No herbal / OTC supplements   Local labs - Normal LFTs (05/2023) - AST 176, ALT 192 (06/17/23) - AST 573, ALT 634, ALKP 135, T bili 0.8 (07/19/23) - AST 801, ALT 938, ALKP 135, T bili 1.0 (07/23/23) - AST 881, ALT 1036, ALKP 151, T bili 1.2. INR 1.2.  (07/26/23) - AST 836, ALT 1080, ALKP 224, T bili 1.0. INR 1.1.  (07/31/23) - AST 760, ALT 867, ALKP 205, T bili 1.7. INR 1.1. Abumin 4.5.  (08/13/23)  - ASMA neg, AMA neg, ceruloplasmin normal, iron panel normal - Acute  hepatitis panel neg - CMV IgG+, IgM neg - EBV IgG+, IgM neg - TTG IgA 92 (elevated) - ANA 1:640 - IgG nml - A1AT nml  He underwent liver bx at [facility] 09/04/23 Lobular hepatitis with moderate lobular disarray and scattered collections of ceroid macrophages. Mild portal chronic inflammation with mild  ductular reaction. Portal fibrosis by trichrome.   See comment.    Comment: There is minimal interface activity. The portal inflammatory infiltrate is mainly composed of lymphocytes with some plasma cells (but not prominent). The findings are not typical for autoimmune hepatitis, unless the patient was partially treated. Other differential diagnosis includes medications/herb/toxin induced hepatitis, viral hepatitis and celiac disease related hepatic injury.    Interval History History of Present Illness  Following last visit enzymes had trended down and plan was for monitoring. He reached out last week after experiencing indigestion for the past two weeks, characterized by severe discomfort and pain localized to the chest near the breastbone. Initially, he managed the symptoms with Tums, but the pain intensified, leading to the use of omeprazole  20 mg, which he has been taking for about a week and a half after calling [name]. He reports that he no longer has the sharp, shooting pain but continues to experience frequent belching regardless of diet. He also reports a 'chunky feeling' in his throat and experienced slight vomiting yesterday. He is currently on a soft iet to avoid throat irritation.  He has a history of celiac disease and adheres to a gluten-free diet, except for  a week in early August when he consumed gluten multiple times without symptoms. He resumed a gluten-free diet immediately after. His liver enzymes were previously elevated but had improved by July 30th, with AST at 48, ALT at 55, and alkaline phosphatase at 67. However, recent labs show elevated levels again, with AST at 974, ALT  at 1000, alkaline phosphatase at 260, and bilirubin at 2.1.  He has a history of low heart rate episodes which had delayed prior bx and underwent an echocardiogram and Holter monitor testing, which were normal per his report. He is scheduled to see a cardiologist soon. No recent episodes of low heart rate or dizziness, which had previously improved. No diarrhea, bloating, fever, or recent illness. He reports no alcohol use.  He notes a new discomfort under his throat, describing it as a sensitive area when touched, though he has not experienced a sore throat. He has not had recent celiac antibody testing, which is planned for October. His current medications include omeprazole  and occasional ibuprofen  for headaches.   Results     Procedure Component Value Units Date/Time   Pathology - General / Other [[id number]] Collected: 09/04/23 0921   Specimen: Tissue-Pathology from Liver Updated: 09/06/23 1331    Case Report --    Surgical Pathology                                Case: [id number]                                Authorizing Provider:  Myrna Manuelita Longs, MD    Collected:           09/04/2023 9078             Ordering Location:     [facility]    Received:            09/04/2023 1144                                    [facility]                                                                  Pathologist:           Robina Parr, MD                                                           Specimen:    Liver, Random Right Liver                                                                 DIAGNOSIS --    A. 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 Mild portal chronic inflammation with mild  ductular reaction. Portal fibrosis by trichrome.   See comment.   Comment: There is minimal interface activity. The portal inflammatory infiltrate is mainly composed of lymphocytes with  some plasma cells (but not prominent). The findings are not typical for autoimmune hepatitis, unless the patient was partially treated. Other differential diagnosis includes medications/herb/toxin induced hepatitis, viral hepatitis and celiac disease related hepatic injury.    Reviewed by resident/fellow HITT, ABIGAIL      Clinical Information --    Abnormal LFTs (liver function tests)  Per hepatology note 08/16/23:  Dennis Simon is a 43 y.o. male with HTN, DM2, GERD, migraines, and recently dx'd celiac disease, and hx of candida esophagitis   Patient has no history of prior liver disease with normal liver tests as recent as Feb 2025, but then acutely increased starting early March with peak at AST 880s, ALT 1000s, ALKP 200s, normal bili. Partial serologic work-up was overall unrevealing (including acute hepatitis panel, ASMA, AMA, ceruloplasmin, iron studies), with exception of +TTG IgA and EGD 06/2023 (done for dysphagia, n/v, early satiety and reflux) with endoscopic / path findings consistent with celiac disease and candida esophagitis. CT A/P with no focal liver abnormality, no gallstones nor biliary dilatation. He denies ETOH use, OTC/herbal supplementation, new medications and family hx of liver disease. He does have some metabolic risk factors (HTN and DM) but overall well-controlled and unlikely driving his significant liver injury. He clinically is well-compensated with no evidence of decompensation, but he does note pruritus, brain fog, fatigue and weight loss -- which may be multifactorial but likely driven by his celiac disease.    His liver enzymes remain elevated today and the etiology of his hepatocellular transaminitis remains unclear. Query viral etiology given acute onset and in setting of somewhat odd finding of esophageal candidiasis given no clear risk factors, particularly with diabetes well-controlled.  Abdominal CT 08/17/23:  - Liver: Normal in morphology and enhancement.  No  suspicious hepatic masses are identified.  The portal and hepatic veins are patent.   - Biliary and Gallbladder: No intrahepatic or extrahepatic bile duct dilatation. The gallbladder is normal in appearance.   Latest Reference Range & Units 08/16/23 10:16  AST 15 - 41 U/L 803 (H)  ALT 15 - 50 U/L 833 (H)  Bilirubin, Total 0.4 - 1.5 mg/dL 2.0 (H)  Alkaline Phosphatase 24 - 110 U/L 179 (H)  Albumin 3.5 - 4.8 g/dL 4.4  Platelet Count /L 150 - 450 x10^9/L 224  Prothrombin Time 9.5 - 13.1 sec 12.1  Prothrombin INR 0.9 - 1.1  1.0  Anti-Nuclear Antibody Screen Negative  Positive !  ANA Titer #1  1:640  ANA Pattern #1  Homogeneous  ANA Titer #2  1:640  ANA Pattern #2  Speckled  IgG 588 - 1,573 mg/dL 8,449  Alpha-1 Antitrypsin 90 - 200 mg/dL 817  EBV DNA QUANT By PCR Not Detected  Not Detected  Hepatitis A Virus, Antibody Negative  Negative  Hepatitis B Core Antibody Total NonReactive  NonReactive  (H): Data is abnormally high !: Data is abnormal     Gross Examination --    A. Random right liver, received in formalin is a 4.0 cm in length by 0.1 cm in diameter aggregate of 2 tan soft tissue cores; submitted entirely in block A1.  S. Allison/Dr. Eveline     Microscopic Examination --  Microscopic examination is performed.  Sections demonstrate 2 cores of hepatic parenchyma with 37 portal areas present for review. Portal tracts are associated with mononuclear inflammatory infiltrate in the portal tracts with some but not prominent plasma cells.  There is minimal interface activity associated rare acidophil bodies.  Within the portal tracts, interlobular sized bile ducts are unremarkable.  There is ductular reaction present.   There is no canalicular cholestasis. There is no macrovesicular steatosis. There are no ballooned hepatocytes. Lobular parenchyma shows inflammation, predominantly comprised of ceroid laden macrophages and lymphocytes. Terminal hepatic venules are unremarkable.   Zone 3 sinusoids are not dilated.     Immunohistochemistry --    To identify the etiology, special stains are performed, and the results are as follow: Iron stain: negative Trichrome stain: increased portal fibrosis Reticulin stain: moderate disarray PAS-D stain: negative for cytoplasmic hyaline globules      Additional Documentation --    All immunohistochemistry, in situ hybridization tests and special stains performed at [facility] and reported herein were developed, validated and their performance characteristics determined by the [facility]. During the performance of these tests, appropriate positive and negative control slides are also performed and reviewed. All control slides and internal controls (when applicable) demonstrate the expected immunoreactive patterns and/or nucleic acid hybridization. These ancillary studies were deemed medically necessary by the requesting pathologist. They were ordered following review of the H&E and clinical history except when part of a liver/kidney protocol or where clinical history (e.g. immunocompromised, critically ill, history of malignancy) clearly indicates. Some of the tests may not be cleared or approved by the U.S. Food and Drug Administration (FDA).  The FDA has determined that such clearance or approval is not necessary.  These tests are used for clinical purposes and should not be regarded as investigational or as research.  This laboratory is certified under the Clinical Laboratory Improvement Amendments of 1988 (CLIA) as qualified to perform high complexity clinical testing.     Attestation --    All of the diagnostic evaluations on the enumerated specimens have been personally conducted by the pathologists involved in the care of this patient as indicated by the electronic signatures above.         General Review of Systems:  10 point ROS performed and negative except for as noted in HPI or  below:  Past Medical History:  Diagnosis Date  . Celiac disease (HHS-HCC)   . Systemic hypertension   . Type 2 diabetes mellitus (CMS/HHS-HCC)     Past Surgical History:  Procedure Laterality Date  . EGD    . TONSILLECTOMY      Allergies: Hydrocodone, Jardiance  [empagliflozin ], Metformin, and Tramadol  Medications: Current Outpatient Medications  Medication Sig Dispense Refill  . ibuprofen  (MOTRIN ) 400 MG tablet Take 400 mg by mouth every 6 (six) hours as needed for Pain     No current facility-administered medications for this visit.    Social History: He  reports that he has never smoked. He has never been exposed to tobacco smoke. He has never used smokeless tobacco. He reports that he does not drink alcohol.  Family History: His family history includes Aneurysm in his mother; Diabetes type II in his mother.  Physical Examination: AA NAD   Labs and Studies: No visits with results within 1 Day(s) from this visit.  Latest known visit with results is:  Patient Message on 01/02/2024  Component Date Value Ref Range Status  . Glucose Random - [facility] 01/04/2024  82  70 - 99 mg/dL Final  . Blood Urea Nitrogen - [facility] 01/04/2024 8  6 - 24 mg/dL Final  . Creatinine  - [facility] 01/04/2024 0.92  0.76 - 1.27 mg/dL Final  . EGFR (CKD-EPI 2021) - [facility] 01/04/2024 106  >59 mL/min/1.73 Final  . Bun/Creatinine Ratio - [facility] 01/04/2024 9  9 - 20 Final  . Sodium - [facility] 01/04/2024 138  134 - 144 mmol/L Final  . Potassium - [facility] 01/04/2024 4.8  3.5 - 5.2 mmol/L Final  . Chloride - [facility] 01/04/2024 100  96 - 106 mmol/L Final  . Carbon Dioxide - [facility] 01/04/2024 21  20 - 29 mmol/L Final  . Calcium - [facility] 01/04/2024 9.4  8.7 - 10.2 mg/dL Final  . Protein Total - [facility] 01/04/2024 7.5  6.0 - 8.5 g/dL Final  . Albumin - [facility] 01/04/2024 4.2  4.1 - 5.1 g/dL Final  . Globulin, Total - [facility] 01/04/2024 3.3  1.5 - 4.5 g/dL Final  . Bilirubin Total - [facility] 01/04/2024  2.1 (H)  0.0 - 1.2 mg/dL Final  . Alkaline Phosphatase - [facility] 01/04/2024 265 (H)  47 - 123 IU/L Final                 **Please note reference interval change**  . AST (SGOT) - [facility] 01/04/2024 974 (>)  0 - 40 IU/L Final   Comment: Results confirmed on dilution.   . ALT (SGPT) - [facility] 01/04/2024 1,010 (>)  0 - 44 IU/L Final   Comment: Results confirmed on dilution.   . INR - [facility] 01/04/2024 1.1  0.9 - 1.2 Final   Comment: Reference interval is for non-anticoagulated patients. Suggested INR therapeutic range for Vitamin K antagonist therapy:    Standard Dose (moderate intensity                   therapeutic range):       2.0 - 3.0    Higher intensity therapeutic range       2.5 - 3.5   . Prothrombin Time - [facility] 01/04/2024 11.7  9.1 - 12.0 sec Final  . White Blood Cell Count - [facility] 01/04/2024 3.5  3.4 - 10.8 x10E3/uL Final  . Red Blood Cell Count - [facility] 01/04/2024 4.30  4.14 - 5.80 x10E6/uL Final  . Hemoglobin - [facility] 01/04/2024 13.0  13.0 - 17.7 g/dL Final  . Hematocrit - [facility] 01/04/2024 39.5  37.5 - 51.0 % Final  . MCV - [facility] 01/04/2024 92  79 - 97 fL Final  . MCH  - [facility] 01/04/2024 30.2  26.6 - 33.0 pg Final  . MCHC - [facility] 01/04/2024 32.9  31.5 - 35.7 g/dL Final  . RDW - [facility] 01/04/2024 13.7  11.6 - 15.4 % Final  . Platelets - [facility] 01/04/2024 197  150 - 450 x10E3/uL Final  . Neutrophils - [facility] 01/04/2024 55  Not Estab. % Final  . LYMPHS -  [facility] 01/04/2024 25  Not Estab. % Final  . Monocytes - [facility] 01/04/2024 10  Not Estab. % Final  . Eos - [facility] 01/04/2024 9  Not Estab. % Final  . Basos - [facility] 01/04/2024 1  Not Estab. % Final  . Neutrophils (Absolute) - [facility] 01/04/2024 1.9  1.4 - 7.0 x10E3/uL Final  . Lymphs (Absolute) - [facility] 01/04/2024 0.9  0.7 - 3.1 x10E3/uL Final  . Monocytes(Absolute) - [facility] 01/04/2024 0.4  0.1 - 0.9 x10E3/uL Final  . Eos (Absolute) - [facility] 01/04/2024 0.3  0.0 -  0.4 x10E3/uL Final  . Baso  (Absolute) - [facility] 01/04/2024 0.0  0.0 - 0.2 x10E3/uL Final  . Immature Granulocytes - [facility] 01/04/2024 0  Not Estab. % Final  . Immature Grans (Abs) - [facility] 01/04/2024 0.0  0.0 - 0.1 x10E3/uL Final   Imaging: Results for orders placed during the hospital encounter of 08/16/23  CT ABDOMINAL OUTSIDE INTERPRETATION  Narrative **INTERPRETATION OF OUTSIDE IMAGING**  INDICATIONS: R79.89 Other specified abnormal findings of blood chemistry  COMPARISONS: None  TECHNIQUE: - Patient full name: Javar Savard - Date of Original Study: 07/19/2023 - Study: CT abdomen with contrast - Study Technique: Multidetector CT imaging of the abdomen was performed using the standard protocol following bolus administration of intravenous contrast.   FINDINGS:  - Lower Thorax: No suspicious pulmonary abnormalities. No pleural or pericardial effusions.  - Liver: Normal in morphology and enhancement.  No suspicious hepatic masses are identified.  The portal and hepatic veins are patent.  - Biliary and Gallbladder: No intrahepatic or extrahepatic bile duct dilatation. The gallbladder is normal in appearance.  - Spleen: Normal in appearance.  - Pancreas: Normal in appearance.  - Adrenal Glands: Normal in appearance.  - Kidneys: No suspicious renal lesions. No hydronephrosis.  - Abdominal and Pelvic Vasculature: No abdominal aortic aneurysm.  - Gastrointestinal Tract: No abnormal dilation or wall thickening. Normal appendix.  - Peritoneum/Mesentery/Retroperitoneum: No free fluid.  No free intraperitoneal air.  - Lymph Nodes: Multiple prominent mesenteric lymph nodes within the left mesentery, for reference a 0.8 cm lymph node (2, 26).  - Body Wall: Unremarkable.  - Musculoskeletal:  No aggressive appearing osseous lesions. Age-indeterminate mild anterior wedging of the L2 vertebral body.  Impression 1.  Prominent mesenteric lymph nodes in the left mesentery measuring up to 0.8 cm  are not enlarged by size criteria and may be reactive in etiology. 2.  Age-indeterminate anterior wedging of L2 vertebral body.  Please note: Our interpretation of studies performed at an outside institution is limited by factors including absence of technical specifics of the image, undisclosed clinical information and the unavailability of the original interpretation.  Specialists at the institution that performed this study may have access to information not available to us  that could make a difference in the interpretation.  We suggest that you obtain the original interpretation from the site where the study was performed.  Electronically Reviewed by:  Margurite Hope, MD, [facility] Electronically Reviewed on:  08/17/2023 1:58 PM  I have reviewed the images and concur with the above findings.  Electronically Signed by:  Edsel Isaacs, MD, [facility] Electronically Signed on:  08/17/2023 2:28 PM   .  GI procedures:   EGD 06/29/23 (for dysphagia, n/v, early satiety, reflux) - The examined esophagus was normal. Biopsies were obtained from the esophagus with cold forceps for histology of suspected eosinophilic esophagitis. - Patchy mild inflammation was found in the entire examined stomach. Biopsies wore taken with a cold forceps for histology. The exam of the stomach was otherwise normal. - Diffuse moderate mucosal changes characterized by smoothness were found in the first portion or the duodenum, in the second portion of the duodenum and in the duodenal bulb. Biopsies were taken with a cold forceps for histology. - Path: duodenal mucosa with villous blunting, increased lamina propria chronic inflammation and intraepithelial lymphocytosis; moderate chronic inactive gastritis (neg H pylori) and candida esophagitis (neg EoE)  Assessment & Plan: Dennis Simon is a 44 y.o. male with HTN, DM2, GERD, migraines, and recently dx'd celiac disease  who presents for f/u of elevated liver  enzymes to 1000s in context of newly dx celiac disease. Serologic workup neg other than ANA pos. Liver bx more reactive findings that underlying primary liver disease. No clear AIH. LIver enzymes downtrended w/celiac treatment and had almost normalized by July 30 on gluten free diet. He then did intake gluten in August and now has indigestion and significant rise in enzymes.   Quite odd presentation with initial presentation and bx without clear AIH and enzymes improved on their own with adherence to GFD. Serologic workup other than celiac notable for positive ANA. Now w/significant rise in enzymes w/indigestion sx. No prior stone/gb disease.     Assessment & Plan  Markedly elevated liver enzymes of unclear etiology Liver enzymes have significantly increased since July, with AST at 974, ALT at 1000, and bilirubin at 2.1. Previous imaging in April showed normal gallbladder and no bile duct dilation. Differential includes autoimmune liver disease, gallstones, or other hepatic pathology. Current levels are unusually high for celiac-related liver enzyme elevation though improved before. Indigestion and fatigue may be related to liver dysfunction. - Order expedited MRCP of the liver. If MRI cannot be expedited, order an ultrasound to r/o GB pathology with recurrent abrupt rise/fall in enzymes - CBC reviewed and no abnml diff to suggest hematologic process - Order repeat ceruloplasmin and then 24 hour urine copper - Consider starting steroids if liver enzymes continue to rise as possibly this is autoimmune in nature given the abrupt rise and he did have some portal fibrosis on last bx - Check Elbert Shine virus given possible uncomfortable LN (was neg before)  Fatigue, likely related to liver dysfunction Intermittent severe fatigue, possibly related to liver dysfunction.  Indigestion and chest discomfort with belching Indigestion and chest discomfort began two weeks ago, with belching and occasional  vomiting. Symptoms improved with omeprazole  20 mg, but belching persists.  - Continue omeprazole  20 mg daily. - Rec f/u with GI for repeat GED  Celiac disease, will check ttg ab  Keep Oct video follow up  [facility] SAMMUEL NOVAK, MD  This video encounter was conducted with the patient's (or proxy's) verbal consent via secure, interactive audio and video telecommunications while in clinic/office/hospital.  The patient (or proxy) was instructed to have this encounter in a suitably private space and to only have persons present to whom they give permission to participate. In addition, patient identity was confirmed by use of name plus an additional identifier.  This visit was coded based on medical decision making (MDM).

## 2024-01-08 ENCOUNTER — Encounter (INDEPENDENT_AMBULATORY_CARE_PROVIDER_SITE_OTHER): Payer: Self-pay

## 2024-01-08 NOTE — Progress Notes (Deleted)
 Cardiology Office Note   Date:  01/08/2024  ID:  Dennis Simon, Dennis Simon 11/02/1980, MRN [id number] PCP: Kip Righter, MD  [facility] Providers Cardiologist:  None Electrophysiologist:  Will Gladis Norton, MD { Click to update primary MD,subspecialty MD or APP then REFRESH:1}    History of Present Illness Dennis Simon is a 43 y.o. male who works in maintenance of [employer] with a past medical history of hypertension, celiac disease, Candida esophagitis, type 2 diabetes, migraine, anxiety, elevated LFTs who was recently hospitalized from 5/6 to 08/23/2023 for lightheadedness and near syncope and is following up.  Patient follows with [facility] and was referred to [facility] for abnormal LFTs noted 05/2023.  He was pending a liver biopsy but this was canceled due to telemetry showing bradycardia, possibly in the 30s per prior records.  Also had noted 30 pound weight loss in 2 to 3 months at his hospitalization in May.  During his hospitalization he was seen by Dr. Norton with the EP for sinus bradycardia, dizziness and lightheadedness.  He was discharged with plans to have 2-week event monitor and outpatient follow-up.  Tobacco use: *** Alcohol use: *** Activity level: *** Diet mainly consists of: *** Pertinent family history: ***    ROS:  ROS  Physical Exam  Physical Exam  VS:  There were no vitals taken for this visit.        Wt Readings from Last 3 Encounters:  09/21/23 166 lb (75.3 kg)  08/27/23 162 lb (73.5 kg)  08/23/23 156 lb 8.4 oz (71 kg)     EKG Interpretation Date/Time:    Ventricular Rate:    PR Interval:    QRS Duration:    QT Interval:    QTC Calculation:   R Axis:      Text Interpretation:      Studies Reviewed   Echocardiogram 08/22/2023: -LVEF 60 to 65% with no regional wall motion abnormalities -Grade 1 diastolic dysfunction  1 week event monitor 10/12/2022 Patch Wear Time:  5 days and 23 hours  HR 37 - 150, average 59 bpm. 8 nonsustained  SVT (longest 7 beats) No atrial fibrillation detected. Rare supraventricular ectopy. Rare ventricular ectopy. No sustained arrhythmias. Symptom trigger episodes correspond to sinus rhythm    Risk Assessment/Calculations  {Does this patient have ATRIAL FIBRILLATION?:[id number]} No BP recorded.  {Refresh Note OR Click here to enter BP  :1}***        ASCVD risk score: The 10-year ASCVD risk score (Arnett DK, et al., 2019) is: 3.6%   Values used to calculate the score:     Age: 30 years     Clincally relevant sex: Male     Is Non-Hispanic African American: No     Diabetic: Yes     Tobacco smoker: No     Systolic Blood Pressure: 123 mmHg     Is BP treated: No     HDL Cholesterol: 36.5 mg/dL     Total Cholesterol: 180 mg/dL   ASSESSMENT  *** *** *** *** *** *** *** *** *** *** *** ***   Plan  *** Cardiac risk counseling and prevention recommendations: Heart healthy/Mediterranean diet with whole grains, fruits, vegetable, fish, lean meats, nuts, and olive oil. Limit salt. Moderate walking, 3-5 times/week for 30-50 minutes each session. Aim for at least 150 minutes.week. Goal should be pace of 3 miles/hour, or walking 1.5 miles in 30 minutes Avoidance of tobacco products. Avoid excess alcohol.  Follow up: ***     {  Are you ordering a CV Procedure (e.g. stress test, cath, DCCV, TEE, etc)?   Press F2        :[id number]}    Signed, Emeline FORBES Calender, MD

## 2024-01-09 ENCOUNTER — Ambulatory Visit: Admitting: Internal Medicine

## 2024-01-10 DIAGNOSIS — R7989 Other specified abnormal findings of blood chemistry: Secondary | ICD-10-CM | POA: Diagnosis not present

## 2024-01-10 DIAGNOSIS — R1084 Generalized abdominal pain: Secondary | ICD-10-CM | POA: Diagnosis not present

## 2024-01-10 DIAGNOSIS — K30 Functional dyspepsia: Secondary | ICD-10-CM | POA: Diagnosis not present

## 2024-01-11 ENCOUNTER — Other Ambulatory Visit (HOSPITAL_COMMUNITY): Payer: Self-pay

## 2024-01-11 MED ORDER — PREDNISONE 10 MG PO TABS
40.0000 mg | ORAL_TABLET | Freq: Every day | ORAL | 2 refills | Status: AC
  Filled 2024-01-11: qty 120, 30d supply, fill #0
  Filled 2024-02-21: qty 120, 30d supply, fill #1

## 2024-01-14 ENCOUNTER — Other Ambulatory Visit (HOSPITAL_COMMUNITY): Payer: Self-pay | Admitting: Internal Medicine

## 2024-01-14 DIAGNOSIS — K900 Celiac disease: Secondary | ICD-10-CM | POA: Diagnosis not present

## 2024-01-14 DIAGNOSIS — K30 Functional dyspepsia: Secondary | ICD-10-CM | POA: Diagnosis not present

## 2024-01-14 DIAGNOSIS — R1084 Generalized abdominal pain: Secondary | ICD-10-CM

## 2024-01-14 DIAGNOSIS — R17 Unspecified jaundice: Secondary | ICD-10-CM | POA: Diagnosis not present

## 2024-01-14 DIAGNOSIS — R7989 Other specified abnormal findings of blood chemistry: Secondary | ICD-10-CM

## 2024-01-15 ENCOUNTER — Other Ambulatory Visit (HOSPITAL_COMMUNITY): Payer: Self-pay

## 2024-01-15 MED ORDER — INSULIN PEN NEEDLE 32G X 4 MM MISC
Freq: Every day | 0 refills | Status: DC
  Filled 2024-01-18: qty 100, 90d supply, fill #0

## 2024-01-15 MED ORDER — LANTUS SOLOSTAR 100 UNIT/ML ~~LOC~~ SOPN
10.0000 [IU] | PEN_INJECTOR | Freq: Every day | SUBCUTANEOUS | 0 refills | Status: AC
  Filled 2024-01-15: qty 15, 37d supply, fill #0

## 2024-01-16 ENCOUNTER — Other Ambulatory Visit (HOSPITAL_COMMUNITY): Payer: Self-pay

## 2024-01-16 ENCOUNTER — Ambulatory Visit: Admitting: Cardiovascular Disease

## 2024-01-16 DIAGNOSIS — R945 Abnormal results of liver function studies: Secondary | ICD-10-CM | POA: Diagnosis not present

## 2024-01-16 DIAGNOSIS — R7309 Other abnormal glucose: Secondary | ICD-10-CM | POA: Diagnosis not present

## 2024-01-16 MED ORDER — TRESIBA FLEXTOUCH 100 UNIT/ML ~~LOC~~ SOPN
10.0000 [IU] | PEN_INJECTOR | Freq: Every day | SUBCUTANEOUS | 0 refills | Status: AC
  Filled 2024-01-16: qty 15, 37d supply, fill #0
  Filled 2024-01-18: qty 12, 30d supply, fill #0

## 2024-01-18 ENCOUNTER — Other Ambulatory Visit (HOSPITAL_COMMUNITY): Payer: Self-pay

## 2024-01-20 DIAGNOSIS — R7989 Other specified abnormal findings of blood chemistry: Secondary | ICD-10-CM | POA: Diagnosis not present

## 2024-01-24 DIAGNOSIS — R7989 Other specified abnormal findings of blood chemistry: Secondary | ICD-10-CM | POA: Diagnosis not present

## 2024-01-29 ENCOUNTER — Other Ambulatory Visit (HOSPITAL_COMMUNITY): Payer: Self-pay

## 2024-01-30 ENCOUNTER — Other Ambulatory Visit (HOSPITAL_COMMUNITY): Payer: Self-pay

## 2024-01-30 DIAGNOSIS — E119 Type 2 diabetes mellitus without complications: Secondary | ICD-10-CM | POA: Diagnosis not present

## 2024-01-30 DIAGNOSIS — R5383 Other fatigue: Secondary | ICD-10-CM | POA: Diagnosis not present

## 2024-01-30 DIAGNOSIS — R945 Abnormal results of liver function studies: Secondary | ICD-10-CM | POA: Diagnosis not present

## 2024-01-30 MED ORDER — GLIPIZIDE ER 2.5 MG PO TB24
2.5000 mg | ORAL_TABLET | Freq: Every day | ORAL | 0 refills | Status: AC
  Filled 2024-01-30: qty 30, 30d supply, fill #0

## 2024-01-31 ENCOUNTER — Encounter (HOSPITAL_COMMUNITY): Payer: Self-pay

## 2024-01-31 ENCOUNTER — Other Ambulatory Visit (HOSPITAL_COMMUNITY): Payer: Self-pay

## 2024-02-01 ENCOUNTER — Other Ambulatory Visit (HOSPITAL_COMMUNITY): Payer: Self-pay

## 2024-02-05 ENCOUNTER — Other Ambulatory Visit (HOSPITAL_COMMUNITY): Payer: Self-pay

## 2024-02-05 DIAGNOSIS — K900 Celiac disease: Secondary | ICD-10-CM | POA: Diagnosis not present

## 2024-02-05 DIAGNOSIS — E1165 Type 2 diabetes mellitus with hyperglycemia: Secondary | ICD-10-CM | POA: Diagnosis not present

## 2024-02-05 DIAGNOSIS — K754 Autoimmune hepatitis: Secondary | ICD-10-CM | POA: Diagnosis not present

## 2024-02-05 MED ORDER — BUDESONIDE 3 MG PO CPEP
9.0000 mg | ORAL_CAPSULE | Freq: Every morning | ORAL | 5 refills | Status: DC
  Filled 2024-02-05: qty 90, 30d supply, fill #0

## 2024-02-05 MED ORDER — LORAZEPAM 0.5 MG PO TABS
0.5000 mg | ORAL_TABLET | Freq: Two times a day (BID) | ORAL | 0 refills | Status: AC
  Filled 2024-02-05: qty 2, 1d supply, fill #0

## 2024-02-08 DIAGNOSIS — R7989 Other specified abnormal findings of blood chemistry: Secondary | ICD-10-CM | POA: Diagnosis not present

## 2024-02-10 ENCOUNTER — Other Ambulatory Visit (HOSPITAL_COMMUNITY): Payer: Self-pay

## 2024-02-11 ENCOUNTER — Other Ambulatory Visit: Payer: Self-pay

## 2024-02-11 ENCOUNTER — Other Ambulatory Visit (HOSPITAL_COMMUNITY): Payer: Self-pay

## 2024-02-11 DIAGNOSIS — E1165 Type 2 diabetes mellitus with hyperglycemia: Secondary | ICD-10-CM | POA: Diagnosis not present

## 2024-02-11 MED ORDER — INSULIN PEN NEEDLE 32G X 4 MM MISC
Freq: Two times a day (BID) | 1 refills | Status: AC
  Filled 2024-02-11 – 2024-02-28 (×4): qty 100, 50d supply, fill #0
  Filled 2024-02-28: qty 100, fill #0

## 2024-02-11 MED ORDER — FREESTYLE LIBRE 3 READER DEVI
0 refills | Status: AC
  Filled 2024-02-11: qty 1, 90d supply, fill #0

## 2024-02-11 MED ORDER — INSULIN LISPRO (1 UNIT DIAL) 100 UNIT/ML (KWIKPEN)
20.0000 [IU] | PEN_INJECTOR | Freq: Two times a day (BID) | SUBCUTANEOUS | 3 refills | Status: AC
  Filled 2024-02-11: qty 12, 30d supply, fill #0
  Filled 2024-03-12: qty 12, 30d supply, fill #1

## 2024-02-19 DIAGNOSIS — R7989 Other specified abnormal findings of blood chemistry: Secondary | ICD-10-CM | POA: Diagnosis not present

## 2024-02-21 ENCOUNTER — Other Ambulatory Visit (HOSPITAL_COMMUNITY): Payer: Self-pay

## 2024-02-21 ENCOUNTER — Other Ambulatory Visit: Payer: Self-pay

## 2024-02-21 MED ORDER — FREESTYLE LIBRE 3 PLUS SENSOR MISC
11 refills | Status: AC
  Filled 2024-02-21 – 2024-02-26 (×3): qty 2, 30d supply, fill #0
  Filled 2024-03-06: qty 2, 28d supply, fill #0
  Filled 2024-04-01: qty 2, 28d supply, fill #1
  Filled 2024-04-29: qty 2, 28d supply, fill #2
  Filled 2024-04-29: qty 2, 30d supply, fill #0

## 2024-02-21 MED ORDER — FREESTYLE LIBRE 3 READER DEVI
0 refills | Status: AC
  Filled 2024-02-22: qty 1, fill #0

## 2024-02-21 MED ORDER — OMEPRAZOLE 40 MG PO CPDR
40.0000 mg | DELAYED_RELEASE_CAPSULE | Freq: Two times a day (BID) | ORAL | 3 refills | Status: AC
  Filled 2024-02-21: qty 60, 30d supply, fill #0
  Filled 2024-04-01: qty 60, 30d supply, fill #1

## 2024-02-21 MED ORDER — AZATHIOPRINE 50 MG PO TABS
50.0000 mg | ORAL_TABLET | Freq: Every day | ORAL | 11 refills | Status: DC
  Filled 2024-02-21: qty 30, 30d supply, fill #0
  Filled 2024-03-05: qty 30, 30d supply, fill #1

## 2024-02-22 ENCOUNTER — Other Ambulatory Visit (HOSPITAL_COMMUNITY): Payer: Self-pay

## 2024-02-27 ENCOUNTER — Other Ambulatory Visit (HOSPITAL_COMMUNITY): Payer: Self-pay

## 2024-02-28 ENCOUNTER — Other Ambulatory Visit (HOSPITAL_COMMUNITY): Payer: Self-pay

## 2024-03-03 DIAGNOSIS — R7989 Other specified abnormal findings of blood chemistry: Secondary | ICD-10-CM | POA: Diagnosis not present

## 2024-03-05 ENCOUNTER — Other Ambulatory Visit (HOSPITAL_COMMUNITY): Payer: Self-pay

## 2024-03-06 ENCOUNTER — Other Ambulatory Visit (HOSPITAL_COMMUNITY): Payer: Self-pay

## 2024-03-06 MED ORDER — AZATHIOPRINE 50 MG PO TABS
100.0000 mg | ORAL_TABLET | Freq: Every day | ORAL | 11 refills | Status: AC
  Filled 2024-03-06: qty 60, 30d supply, fill #0
  Filled 2024-04-01: qty 60, 30d supply, fill #1
  Filled 2024-05-06: qty 60, 30d supply, fill #0

## 2024-03-10 DIAGNOSIS — E1165 Type 2 diabetes mellitus with hyperglycemia: Secondary | ICD-10-CM | POA: Diagnosis not present

## 2024-03-10 DIAGNOSIS — R748 Abnormal levels of other serum enzymes: Secondary | ICD-10-CM | POA: Diagnosis not present

## 2024-03-10 DIAGNOSIS — D849 Immunodeficiency, unspecified: Secondary | ICD-10-CM | POA: Diagnosis not present

## 2024-03-10 DIAGNOSIS — K900 Celiac disease: Secondary | ICD-10-CM | POA: Diagnosis not present

## 2024-03-10 DIAGNOSIS — Z794 Long term (current) use of insulin: Secondary | ICD-10-CM | POA: Diagnosis not present

## 2024-03-10 DIAGNOSIS — K754 Autoimmune hepatitis: Secondary | ICD-10-CM | POA: Diagnosis not present

## 2024-03-17 DIAGNOSIS — I10 Essential (primary) hypertension: Secondary | ICD-10-CM | POA: Diagnosis not present

## 2024-03-17 DIAGNOSIS — R748 Abnormal levels of other serum enzymes: Secondary | ICD-10-CM | POA: Diagnosis not present

## 2024-03-17 DIAGNOSIS — R42 Dizziness and giddiness: Secondary | ICD-10-CM | POA: Diagnosis not present

## 2024-03-17 DIAGNOSIS — E1169 Type 2 diabetes mellitus with other specified complication: Secondary | ICD-10-CM | POA: Diagnosis not present

## 2024-03-19 DIAGNOSIS — R7989 Other specified abnormal findings of blood chemistry: Secondary | ICD-10-CM | POA: Diagnosis not present

## 2024-04-01 ENCOUNTER — Other Ambulatory Visit (HOSPITAL_COMMUNITY): Payer: Self-pay

## 2024-04-02 DIAGNOSIS — R7989 Other specified abnormal findings of blood chemistry: Secondary | ICD-10-CM | POA: Diagnosis not present

## 2024-04-07 DIAGNOSIS — E119 Type 2 diabetes mellitus without complications: Secondary | ICD-10-CM | POA: Diagnosis not present

## 2024-04-09 ENCOUNTER — Other Ambulatory Visit (HOSPITAL_COMMUNITY): Payer: Self-pay

## 2024-04-11 ENCOUNTER — Other Ambulatory Visit (HOSPITAL_COMMUNITY): Payer: Self-pay

## 2024-04-11 MED ORDER — BAQSIMI TWO PACK 3 MG/DOSE NA POWD
1.0000 | NASAL | 1 refills | Status: AC
  Filled 2024-04-11: qty 1, 30d supply, fill #0

## 2024-04-14 ENCOUNTER — Other Ambulatory Visit (HOSPITAL_COMMUNITY): Payer: Self-pay

## 2024-04-23 ENCOUNTER — Other Ambulatory Visit (HOSPITAL_COMMUNITY): Payer: Self-pay

## 2024-04-23 MED ORDER — PREDNISONE 5 MG PO TABS
5.0000 mg | ORAL_TABLET | Freq: Every day | ORAL | 0 refills | Status: AC
  Filled 2024-04-23: qty 30, 30d supply, fill #0

## 2024-04-29 ENCOUNTER — Other Ambulatory Visit (HOSPITAL_COMMUNITY): Payer: Self-pay

## 2024-04-30 ENCOUNTER — Other Ambulatory Visit (HOSPITAL_COMMUNITY): Payer: Self-pay

## 2024-05-06 ENCOUNTER — Other Ambulatory Visit: Payer: Self-pay

## 2024-05-21 ENCOUNTER — Other Ambulatory Visit (HOSPITAL_COMMUNITY): Payer: Self-pay | Admitting: Orthopedic Surgery

## 2024-05-21 ENCOUNTER — Encounter: Payer: Self-pay | Admitting: Orthopedic Surgery

## 2024-05-21 DIAGNOSIS — M25512 Pain in left shoulder: Secondary | ICD-10-CM

## 2024-05-26 ENCOUNTER — Ambulatory Visit (HOSPITAL_COMMUNITY)

## 2024-06-03 ENCOUNTER — Ambulatory Visit: Admitting: Orthopaedic Surgery
